# Patient Record
Sex: Female | Born: 1960 | Race: Black or African American | Hispanic: No | Smoking: Current every day smoker
Health system: Southern US, Community
[De-identification: ages and names within clinical notes are randomized; demographics above are authoritative.]

## PROBLEM LIST (undated history)

## (undated) DIAGNOSIS — I10 Essential (primary) hypertension: Secondary | ICD-10-CM

## (undated) HISTORY — PX: BREAST BIOPSY: SHX20

## (undated) HISTORY — PX: OTHER SURGICAL HISTORY: SHX169

## (undated) HISTORY — PX: REPLACEMENT TOTAL KNEE: SUR1224

---

## 2005-04-29 ENCOUNTER — Ambulatory Visit: Payer: Self-pay | Admitting: Internal Medicine

## 2006-05-05 ENCOUNTER — Ambulatory Visit: Payer: Self-pay | Admitting: Internal Medicine

## 2006-05-07 ENCOUNTER — Ambulatory Visit: Payer: Self-pay | Admitting: Internal Medicine

## 2008-07-21 ENCOUNTER — Ambulatory Visit: Payer: Self-pay | Admitting: Internal Medicine

## 2009-10-10 ENCOUNTER — Ambulatory Visit: Payer: Self-pay | Admitting: Internal Medicine

## 2010-08-11 ENCOUNTER — Emergency Department: Payer: Self-pay | Admitting: Internal Medicine

## 2010-08-21 ENCOUNTER — Ambulatory Visit: Payer: Self-pay | Admitting: Internal Medicine

## 2010-10-17 ENCOUNTER — Ambulatory Visit: Payer: Self-pay | Admitting: Internal Medicine

## 2011-11-21 ENCOUNTER — Ambulatory Visit: Payer: Self-pay | Admitting: Internal Medicine

## 2011-12-23 ENCOUNTER — Ambulatory Visit: Payer: Self-pay | Admitting: General Practice

## 2011-12-23 LAB — BASIC METABOLIC PANEL
Anion Gap: 6 — ABNORMAL LOW (ref 7–16)
BUN: 25 mg/dL — ABNORMAL HIGH (ref 7–18)
Calcium, Total: 9.5 mg/dL (ref 8.5–10.1)
Chloride: 108 mmol/L — ABNORMAL HIGH (ref 98–107)
EGFR (African American): >60
EGFR (Non-African Amer.): >60
Glucose: 89 mg/dL (ref 65–99)
Osmolality: 287 (ref 275–301)
Potassium: 3.1 mmol/L — ABNORMAL LOW (ref 3.5–5.1)

## 2011-12-23 LAB — CBC
MCH: 31.6 pg (ref 26.0–34.0)
MCHC: 32.5 g/dL (ref 32.0–36.0)
MCV: 97 fL (ref 80–100)
Platelet: 287 10*3/uL (ref 150–440)
RDW: 13 % (ref 11.5–14.5)
WBC: 4 10*3/uL (ref 3.6–11.0)

## 2011-12-23 LAB — URINALYSIS, COMPLETE
Blood: NEGATIVE
Ketone: NEGATIVE
Leukocyte Esterase: NEGATIVE
Ph: 6 (ref 4.5–8.0)
Protein: NEGATIVE
RBC,UR: <1 /HPF (ref 0–5)

## 2011-12-23 LAB — PROTIME-INR
INR: 0.9
Prothrombin Time: 12.5 secs (ref 11.5–14.7)

## 2011-12-23 LAB — MRSA PCR SCREENING

## 2011-12-23 LAB — APTT: Activated PTT: 29.3 secs (ref 23.6–35.9)

## 2012-01-06 ENCOUNTER — Inpatient Hospital Stay: Payer: Self-pay | Admitting: General Practice

## 2012-01-06 LAB — PREGNANCY, URINE: Pregnancy Test, Urine: NEGATIVE m[IU]/mL

## 2012-01-07 LAB — BASIC METABOLIC PANEL
Anion Gap: 12 (ref 7–16)
BUN: 13 mg/dL (ref 7–18)
Creatinine: 0.93 mg/dL (ref 0.60–1.30)
EGFR (African American): >60
EGFR (Non-African Amer.): >60

## 2012-01-08 LAB — BASIC METABOLIC PANEL
Anion Gap: 10 (ref 7–16)
BUN: 13 mg/dL (ref 7–18)
Calcium, Total: 8.5 mg/dL (ref 8.5–10.1)
Chloride: 108 mmol/L — ABNORMAL HIGH (ref 98–107)
EGFR (Non-African Amer.): >60
Osmolality: 283 (ref 275–301)
Potassium: 3.5 mmol/L (ref 3.5–5.1)
Sodium: 142 mmol/L (ref 136–145)

## 2012-01-08 LAB — HEMOGLOBIN: HGB: 10.6 g/dL — ABNORMAL LOW (ref 12.0–16.0)

## 2012-01-08 LAB — PLATELET COUNT: Platelet: 187 10*3/uL (ref 150–440)

## 2012-11-24 ENCOUNTER — Ambulatory Visit: Payer: Self-pay | Admitting: Internal Medicine

## 2014-01-20 ENCOUNTER — Ambulatory Visit: Payer: Self-pay | Admitting: Internal Medicine

## 2015-01-23 ENCOUNTER — Ambulatory Visit: Payer: Self-pay | Admitting: Internal Medicine

## 2015-03-05 NOTE — Op Note (Signed)
PATIENT NAME:  [name], [name] MR#:  [id number] DATE OF BIRTH:  [birth year]  DATE OF PROCEDURE:  01/06/2012  PREOPERATIVE DIAGNOSIS: Degenerative arthrosis of the right knee.   POSTOPERATIVE DIAGNOSIS: Degenerative arthrosis of the right knee.   PROCEDURE PERFORMED: Right total knee arthroplasty using computer-assisted navigation.   SURGEON: Illene LabradorJames P. Angie FavaHooten, Jr., MD   ASSISTANT: Van ClinesJon Wolfe, PA-C (required to maintain retraction throughout the procedure)   ANESTHESIA: Femoral nerve block and spinal.   ESTIMATED BLOOD LOSS: 50 mL.   FLUIDS REPLACED: 1600 mL of crystalloid.   TOURNIQUET TIME: 105 minutes.   DRAINS: Two medium drains to reinfusion system.   SOFT TISSUE RELEASES: Anterior cruciate ligament, posterior cruciate ligament, deep medial collateral ligament, patellofemoral ligament.   IMPLANTS UTILIZED DEPUY: DePuy PFC Sigma size 4 posterior stabilized femoral component (cemented), size 4 MBT component (cemented), 38 mm three-peg oval dome patella (cemented), and a 10 mm stabilized rotating platform polyethylene insert.   INDICATIONS FOR SURGERY: The patient is a 54 year old female who has been seen for complaints of progressive right knee pain. X-rays demonstrated severe degenerative changes in tricompartmental fashion with varus deformity. After discussion of the risks and benefits of surgical intervention, the patient expressed her understanding of the risks and benefits and agreed with plans for right total knee arthroplasty.   PROCEDURE IN DETAIL: The patient was brought into the Operating Room, and after adequate femoral nerve block and spinal anesthesia was achieved a tourniquet was placed on the patient's upper right thigh. The patient's right knee and leg were cleaned and prepped with alcohol and DuraPrep and draped in the usual sterile fashion. A "timeout" was performed as per usual protocol. The right lower extremity was exsanguinated using an Esmarch, and the tourniquet was  inflated to 300 mmHg. An anterior longitudinal incision was made, followed by a standard mid vastus approach. A large effusion was evacuated. Deep fibers of the medial collateral ligament were elevated in a subperiosteal fashion off the medial flare of the tibia so as to maintain a continuous soft tissue sleeve. The patella was subluxed laterally and the patellofemoral ligament was incised. Inspection of the knee demonstrated severe degenerative changes in tricompartmental fashion. Eburnated bone was noted to the medial compartment. Prominent osteophytes were debrided using a rongeur. Anterior and posterior cruciate ligaments were excised. Two 4.0 mm Schanz pins were inserted into the femur and into the tibia for attachment of the array of spheres used for computer-assisted navigation. Hip center was identified using a circumduction technique. Distal landmarks were mapped using the computer. The distal femur and proximal tibia were mapped using the computer. The distal femoral cutting guide was positioned using computer-assisted navigation so as to achieve a 5-degree distal valgus cut. Cut was performed and verified using the computer. The distal femur was sized, and it was felt that a size 4 femoral component was appropriate. A size 4 cutting guide was positioned using computer-assisted navigation, and the anterior cut was performed and verified using the computer. This was followed by completion of the posterior and chamfer cuts. The femoral cutting guide for a central box was then positioned, and the central box cut was performed.   Attention was then directed to the proximal tibia. Medial and lateral menisci were excised. The extramedullary tibial cutting guide was positioned using computer-assisted navigation so as to achieve 0 degrees varus-valgus alignment and 0 degrees posterior slope. Cut was performed and verified using the computer. The proximal tibia was then sized, and it was felt that a  size 4 tibial  tray was appropriate. Tibial and femoral trials were inserted, followed by insertion of a 10 mm polyethylene insert. Excellent medial and lateral soft tissue balancing was appreciated both in extension and in flexion. Finally, the patella was cut and prepared so as to accommodate a 38 mm three-peg oval dome patella. The patellar trial was placed and the knee was placed through a range of motion with excellent patellar tracking appreciated.   An osteotome was used to remove osteophytes from the posterior condyles. A large osteochondral loose body was removed from the medial popliteal space using curettes and rongeurs. The central post hole for the tibial tray was then reamed, followed by insertion of a keel punch. Tibial trials were then removed. The cut surfaces of bone were irrigated with copious amounts of normal saline with antibiotic solution using pulsatile lavage and then suctioned dry. Polymethylmethacrylate cement was prepared in the usual fashion using a vacuum mixer. Cement was applied to the cut surface of the proximal tibia as well as along the undersurface of a size 4 MBT tibial component. The tibial component was positioned and impacted in place. Excess cement was removed using freer elevators. Cement was then applied to the cut surface of the femur as well as along the posterior flanges of a size 4 posterior stabilized femoral component. The femoral component was positioned and impacted into place. Excess cement was removed using freer elevators. A 10 mm polyethylene trial was inserted, and the knee was brought in full extension with steady axial compression applied. Finally, cement was applied to the backside of a 38 mm three-peg oval dome patella, and the patellar component was positioned and patellar clamp applied. Excess cement was removed using freer elevators.   After adequate curing of cement, the tourniquet was deflated after a total tourniquet time of 105 minutes. Hemostasis was achieved  using electrocautery. The knee was irrigated with copious amounts of normal saline with antibiotic solution using pulsatile lavage and then suctioned dry. The knee was inspected for any residual cement debris, and 30 mL of 0.25% Marcaine with epinephrine was injected along the posterior capsule. A 10 mm stabilized rotating platform polyethylene insert was placed, and the knee was placed through a range of motion. Excellent medial and lateral soft tissue balancing was appreciated both in full extension and in 90 degrees of flexion. Excellent patellar tracking was appreciated.   Two medium drains were placed in the wound bed and brought out through a separate stab incision to be attached to a reinfusion system. The medial parapatellar portion of the incision was reapproximated interrupted sutures of #1 Vicryl. The subcutaneous tissue was approximated in layers using first #0 Vicryl followed, by 2-0 Vicryl. The skin was closed with skin staples. A sterile dressing was applied.   The patient tolerated the procedure well. She was transported to the recovery room in stable condition.    ____________________________ Illene Labrador. Angie Fava., MD jph:cbb D: 01/06/2012 17:17:23 ET T: 01/07/2012 11:34:08 ET JOB#: [id number]  cc: Fayrene Fearing P. Angie Fava., MD, <Dictator> Illene Labrador Angie Fava MD ELECTRONICALLY SIGNED 01/08/2012 17:54

## 2015-03-05 NOTE — Discharge Summary (Signed)
PATIENT NAME:  [name], [name] MR#:  [id number] DATE OF BIRTH:  1960-12-01  DATE OF ADMISSION:  01/06/2012 DATE OF DISCHARGE:  01/08/2012  ADMITTING DIAGNOSIS: Degenerative arthrosis of the right knee.   DISCHARGE DIAGNOSIS: Degenerative arthrosis of the right knee.   HISTORY: The patient is a 54 year old who has been followed at [facility] for progression of right knee pain. The patient has had greater than a 10 year history of right knee pain. She had previously undergone a right knee arthroscopy in 2000 performed by Dr. Eulah Pont in [place]. She reports progressive worsening of the right knee pain over the last three years. She had localized most of her pain along the medial aspect of the knee. She had not seen any significant improvement in her condition despite the use of anti-inflammatory medications, Synvisc injections as well as cortisone injections and activity modification. Her pain was noted to be aggravated with weight-bearing activities such as stair ambulation. At the time of surgery, she was not using any type of ambulatory aid. She had denied any significant swelling, locking or giving way of the knee. Her pain had progressed to the point that it was significantly interfering with her activities of daily living. X-rays taken in the clinic showed narrowing of the medial cartilage space with associated varus alignment. She was noted to have osteophyte as well as subchondral sclerosis. After a discussion of the risks and benefits of surgical intervention, the patient expressed her understanding of the risks and benefits and agreed for plans for surgical intervention.   PROCEDURE: Right total knee arthroplasty using computer-assisted navigation.   ANESTHESIA: Femoral nerve block and spinal.   SOFT TISSUE RELEASE: Anterior cruciate ligament, posterior cruciate ligament, deep medial collateral ligaments, as well as the patellofemoral ligament.   IMPLANTS UTILIZED: DePuy PFC Sigma size  four posterior stabilized femoral component (cemented), size four MBT tibial component (cemented), 38 mm three-pegged oval dome patella (cemented), and a 10 mm stabilized rotating platform polyethylene insert.   HOSPITAL COURSE: The patient tolerated the procedure very well. She had no complications. She was then taken to the PAC-U where she was stabilized and then transferred to the orthopedic floor. The patient began receiving anticoagulation therapy, Lovenox 30 mg subcutaneous every 12 hours per anesthesia and pharmacy protocol. She was fitted with TED stockings bilaterally. These are allowed to be removed one hour per eight hour shift. The right leg was applied on day two following removal of the Hemovac and dressing change. She was also fitted with the AV-I compression foot pumps set at 80 mmHg bilaterally. She has had no evidence of any deep venous thromboses of the lower extremities. Calves have been nontender. Negative Homans sign. Heels were elevated off the bed using rolled towels. There was no tissue breakdown noted to the heels or any tenderness to palpation.   The patient has denied any chest pain or shortness of breath. Vital signs have been stable. Hemodynamically, she was stable. No transfusions were given other than the Autovac transfusion the first six hours postoperatively.   Physical therapy was initiated on day one for activities of daily living and assistive devices. She has progressed very nicely. Upon being discharged, was ambulating greater than 200 feet. She was able go up and down four sets of steps. She was independent with bed to chair transfers. Occupational therapy was also initiated on day one for activities of daily living and assistive devices.   The patient's IV, catheter, and Hemovac were all discontinued on day two  along with the dressing change. The wound was free of any drainage or signs of infection.   DISPOSITION: The patient is being discharged to home in improved  stable condition.   DISCHARGE INSTRUCTIONS:  1. She may weight bear as tolerated.  2. Continue using a walker until cleared by physical therapy to go to a quad cane.  3. She will continue with TED stockings. These are to be worn during the day, but may remove at night.  4. Continue Polar Care maintaining a temperature of 40 to 50 degrees Fahrenheit.  5. She was instructed in wound care. She is not to get the wound wet before the staples are removed.  6. She will follow-up in clinic in two weeks postoperative for dressing change and removal of staples, or sooner if any temperatures of 101.5 or greater or excessive bleeding.  7. She will receive home health physical therapy.  8. She is placed on a regular diet.  9. She is to resume her regular medication that she was on prior to admission.  10. She was given a prescription for Lovenox 40 mg subcutaneous daily for 14 days, then discontinue and begin taking one 81 mg enteric-coated aspirin.  11. She was given a prescription for oxycodone 1 to 2 tablets every 4 to 6 hours p.r.n. for pain as well as Ultram 1 to 2 tablets every 4 to 6 hours p.r.n. for pain.   PAST MEDICAL HISTORY:  1. Hypertension.  2. Arthritis.  3. Kidney stones.    ____________________________ Van ClinesJon Marshall Roehrich, PA jrw:ap D: 01/20/2012 14:01:40 ET T: 01/20/2012 14:12:57 ET JOB#: [id number]  cc: Van ClinesJon Lashawne Dura, PA, <Dictator> Leondre Taul PA ELECTRONICALLY SIGNED 01/21/2012 7:46

## 2015-12-18 DIAGNOSIS — I878 Other specified disorders of veins: Secondary | ICD-10-CM | POA: Insufficient documentation

## 2015-12-18 DIAGNOSIS — I10 Essential (primary) hypertension: Secondary | ICD-10-CM | POA: Insufficient documentation

## 2015-12-28 ENCOUNTER — Other Ambulatory Visit: Payer: Self-pay | Admitting: Internal Medicine

## 2015-12-28 DIAGNOSIS — Z1231 Encounter for screening mammogram for malignant neoplasm of breast: Secondary | ICD-10-CM

## 2016-01-24 ENCOUNTER — Ambulatory Visit
Admission: RE | Admit: 2016-01-24 | Discharge: 2016-01-24 | Disposition: A | Discharge status: Home / Self Care | Payer: 59 | Source: Ambulatory Visit | Attending: Internal Medicine | Admitting: Internal Medicine

## 2016-01-24 DIAGNOSIS — Z1231 Encounter for screening mammogram for malignant neoplasm of breast: Secondary | ICD-10-CM | POA: Insufficient documentation

## 2016-05-23 ENCOUNTER — Emergency Department
Admission: EM | Admit: 2016-05-23 | Discharge: 2016-05-23 | Disposition: A | Discharge status: Home / Self Care | Payer: 59 | Attending: Emergency Medicine | Admitting: Emergency Medicine

## 2016-05-23 ENCOUNTER — Emergency Department: Payer: 59

## 2016-05-23 ENCOUNTER — Encounter: Payer: Self-pay | Admitting: Emergency Medicine

## 2016-05-23 DIAGNOSIS — S8001XA Contusion of right knee, initial encounter: Secondary | ICD-10-CM

## 2016-05-23 DIAGNOSIS — Y92410 Unspecified street and highway as the place of occurrence of the external cause: Secondary | ICD-10-CM | POA: Diagnosis not present

## 2016-05-23 DIAGNOSIS — Y9389 Activity, other specified: Secondary | ICD-10-CM | POA: Diagnosis not present

## 2016-05-23 DIAGNOSIS — F17200 Nicotine dependence, unspecified, uncomplicated: Secondary | ICD-10-CM | POA: Insufficient documentation

## 2016-05-23 DIAGNOSIS — M25561 Pain in right knee: Secondary | ICD-10-CM | POA: Diagnosis present

## 2016-05-23 DIAGNOSIS — I10 Essential (primary) hypertension: Secondary | ICD-10-CM | POA: Insufficient documentation

## 2016-05-23 DIAGNOSIS — M1712 Unilateral primary osteoarthritis, left knee: Secondary | ICD-10-CM

## 2016-05-23 DIAGNOSIS — M179 Osteoarthritis of knee, unspecified: Secondary | ICD-10-CM | POA: Diagnosis not present

## 2016-05-23 DIAGNOSIS — Y999 Unspecified external cause status: Secondary | ICD-10-CM | POA: Diagnosis not present

## 2016-05-23 DIAGNOSIS — S8002XA Contusion of left knee, initial encounter: Secondary | ICD-10-CM | POA: Diagnosis not present

## 2016-05-23 HISTORY — DX: Essential (primary) hypertension: I10

## 2016-05-23 MED ORDER — CYCLOBENZAPRINE HCL 10 MG PO TABS: 10.0000 mg | ORAL_TABLET | Freq: Three times a day (TID) | ORAL | Status: DC | PRN

## 2016-05-23 MED ORDER — NAPROXEN 500 MG PO TABS: 500.0000 mg | ORAL_TABLET | Freq: Two times a day (BID) | ORAL | Status: DC
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
## 2016-11-11 DIAGNOSIS — M1712 Unilateral primary osteoarthritis, left knee: Secondary | ICD-10-CM | POA: Insufficient documentation

## 2016-11-11 DIAGNOSIS — Z96651 Presence of right artificial knee joint: Secondary | ICD-10-CM | POA: Insufficient documentation

## 2016-12-10 ENCOUNTER — Encounter
Admission: RE | Admit: 2016-12-10 | Discharge: 2016-12-10 | Disposition: A | Discharge status: Home / Self Care | Payer: 59 | Source: Ambulatory Visit | Attending: Orthopedic Surgery | Admitting: Orthopedic Surgery

## 2016-12-10 DIAGNOSIS — I10 Essential (primary) hypertension: Secondary | ICD-10-CM | POA: Insufficient documentation

## 2016-12-10 DIAGNOSIS — Z01812 Encounter for preprocedural laboratory examination: Secondary | ICD-10-CM | POA: Diagnosis present

## 2016-12-10 DIAGNOSIS — Z01810 Encounter for preprocedural cardiovascular examination: Secondary | ICD-10-CM | POA: Insufficient documentation

## 2016-12-10 LAB — COMPREHENSIVE METABOLIC PANEL
ALT: 16 U/L (ref 14–54)
ANION GAP: 8 (ref 5–15)
AST: 18 U/L (ref 15–41)
Albumin: 4.2 g/dL (ref 3.5–5.0)
Alkaline Phosphatase: 91 U/L (ref 38–126)
BUN: 17 mg/dL (ref 6–20)
CHLORIDE: 101 mmol/L (ref 101–111)
CO2: 28 mmol/L (ref 22–32)
CREATININE: 1.04 mg/dL — AB (ref 0.44–1.00)
Calcium: 9.6 mg/dL (ref 8.9–10.3)
GFR, EST NON AFRICAN AMERICAN: 59 mL/min — AB (ref >60)
Glucose, Bld: 88 mg/dL (ref 65–99)
Potassium: 3.2 mmol/L — ABNORMAL LOW (ref 3.5–5.1)
SODIUM: 137 mmol/L (ref 135–145)
Total Bilirubin: 0.6 mg/dL (ref 0.3–1.2)
Total Protein: 7.8 g/dL (ref 6.5–8.1)

## 2016-12-10 LAB — TYPE AND SCREEN
ABO/RH(D): B POS
Antibody Screen: NEGATIVE

## 2016-12-10 LAB — URINALYSIS, ROUTINE W REFLEX MICROSCOPIC
Bilirubin Urine: NEGATIVE
GLUCOSE, UA: NEGATIVE mg/dL
Hgb urine dipstick: NEGATIVE
Ketones, ur: NEGATIVE mg/dL
LEUKOCYTES UA: NEGATIVE
Nitrite: NEGATIVE
Protein, ur: NEGATIVE mg/dL
Specific Gravity, Urine: 1.012 (ref 1.005–1.030)
pH: 6 (ref 5.0–8.0)

## 2016-12-10 LAB — PROTIME-INR
INR: 0.95
PROTHROMBIN TIME: 12.7 s (ref 11.4–15.2)

## 2016-12-10 LAB — CBC
HCT: 38.9 % (ref 35.0–47.0)
Hemoglobin: 13.7 g/dL (ref 12.0–16.0)
MCH: 32.9 pg (ref 26.0–34.0)
MCHC: 35.3 g/dL (ref 32.0–36.0)
MCV: 93.2 fL (ref 80.0–100.0)
PLATELETS: 302 10*3/uL (ref 150–440)
RBC: 4.18 MIL/uL (ref 3.80–5.20)
RDW: 13.3 % (ref 11.5–14.5)
WBC: 4.1 10*3/uL (ref 3.6–11.0)

## 2016-12-10 LAB — C-REACTIVE PROTEIN: CRP: <0.8 mg/dL (ref <1.0)

## 2016-12-10 LAB — SEDIMENTATION RATE: Sed Rate: 16 mm/hr (ref 0–30)

## 2016-12-10 LAB — SURGICAL PCR SCREEN
MRSA, PCR: NEGATIVE
Staphylococcus aureus: NEGATIVE

## 2016-12-10 LAB — APTT: aPTT: 32 seconds (ref 24–36)

## 2016-12-10 NOTE — Pre-Procedure Instructions (Signed)
Potassium result and request for increase in potassium supplement faxed to Dr Elenor LegatoHooten's office.

## 2016-12-10 NOTE — Patient Instructions (Signed)
  Your procedure is scheduled on: 12/23/16 Mon Report to Same Day Surgery 2nd floor medical mall [facility](Medical Mall Entrance-take elevator on left to 2nd floor.  Check in with surgery information desk.) To find out your arrival time please call [phone] between 1PM - 3PM on 12/20/16 Fri Remember: Instructions that are not followed completely may result in serious medical risk, up to and including death, or upon the discretion of your surgeon and anesthesiologist your surgery may need to be rescheduled.    _x___ 1. Do not eat food or drink liquids after midnight. No gum chewing or hard candies.     __x__ 2. No Alcohol for 24 hours before or after surgery.   __x__3. No Smoking for 24 prior to surgery.   ____  4. Bring all medications with you on the day of surgery if instructed.    __x__ 5. Notify your doctor if there is any change in your medical condition     (cold, fever, infections).     Do not wear jewelry, make-up, hairpins, clips or nail polish.  Do not wear lotions, powders, or perfumes. You may wear deodorant.  Do not shave 48 hours prior to surgery. Men may shave face and neck.  Do not bring valuables to the hospital.    [facility] is not [facility] for any belongings or valuables.               Contacts, dentures or bridgework may not be worn into surgery.  Leave your suitcase in the car. After surgery it may be brought to your room.  For patients admitted to the hospital, discharge time is determined by your treatment team.   Patients discharged the day of surgery will not be allowed to drive home.  You will need someone to drive you home and stay with you the night of your procedure.    Please read over the following fact sheets that you were given:   [facility] Pre[facility] and or MRSA Information   _x___ Take these medicines the morning of surgery with A SIP OF WATER:    1. None  2.  3.  4.  5.  6.  ____Fleets enema or Magnesium Citrate as  directed.   _x___ Use CHG Soap or sage wipes as directed on instruction sheet   ____ Use inhalers on the day of surgery and bring to hospital day of surgery  ____ Stop metformin 2 days prior to surgery    ____ Take 1/2 of usual insulin dose the night before surgery and none on the morning of           surgery.   ____ Stop Aspirin, Coumadin, Pllavix ,Eliquis, Effient, or Pradaxa  x__ Stop Anti-inflammatories such as Advil, Aleve, Ibuprofen, Motrin, Naproxen,          Naprosyn, Goodies powders or aspirin products. Ok to take Tylenol.   ____ Stop supplements until after surgery.    ____ Bring C-Pap to the hospital.

## 2016-12-11 LAB — URINE CULTURE
CULTURE: NO GROWTH
SPECIAL REQUESTS: NORMAL

## 2016-12-22 MED ORDER — CLINDAMYCIN PHOSPHATE 900 MG/50ML IV SOLN
900.0000 mg | INTRAVENOUS | Status: AC
  Administered 2016-12-23: 900 mg via INTRAVENOUS

## 2016-12-22 MED ORDER — TRANEXAMIC ACID 1000 MG/10ML IV SOLN
1000.0000 mg | INTRAVENOUS | Status: AC
  Administered 2016-12-23: 1000 mg via INTRAVENOUS
  Filled 2016-12-22: qty 10

## 2016-12-23 ENCOUNTER — Inpatient Hospital Stay: Payer: 59

## 2016-12-23 ENCOUNTER — Inpatient Hospital Stay: Payer: 59 | Admitting: Anesthesiology

## 2016-12-23 ENCOUNTER — Encounter
Admission: RE | Disposition: A | Discharge status: Home / Self Care | Payer: Self-pay | Source: Ambulatory Visit | Attending: Orthopedic Surgery

## 2016-12-23 ENCOUNTER — Encounter: Payer: Self-pay | Admitting: *Deleted

## 2016-12-23 ENCOUNTER — Inpatient Hospital Stay
Admission: RE | Admit: 2016-12-23 | Discharge: 2016-12-25 | DRG: 470 | Disposition: A | Discharge status: Home / Self Care | Payer: 59 | Source: Ambulatory Visit | Attending: Orthopedic Surgery | Admitting: Orthopedic Surgery

## 2016-12-23 DIAGNOSIS — I10 Essential (primary) hypertension: Secondary | ICD-10-CM | POA: Diagnosis present

## 2016-12-23 DIAGNOSIS — M6281 Muscle weakness (generalized): Secondary | ICD-10-CM

## 2016-12-23 DIAGNOSIS — M1712 Unilateral primary osteoarthritis, left knee: Principal | ICD-10-CM | POA: Diagnosis present

## 2016-12-23 DIAGNOSIS — Z96659 Presence of unspecified artificial knee joint: Secondary | ICD-10-CM

## 2016-12-23 HISTORY — PX: KNEE ARTHROPLASTY: SHX992

## 2016-12-23 LAB — POCT I-STAT 4, (NA,K, GLUC, HGB,HCT)
GLUCOSE: 95 mg/dL (ref 65–99)
HCT: 37 % (ref 36.0–46.0)
Hemoglobin: 12.6 g/dL (ref 12.0–15.0)
POTASSIUM: 3.8 mmol/L (ref 3.5–5.1)
SODIUM: 140 mmol/L (ref 135–145)

## 2016-12-23 LAB — ABO/RH: ABO/RH(D): B POS

## 2016-12-23 SURGERY — ARTHROPLASTY, KNEE, TOTAL, USING IMAGELESS COMPUTER-ASSISTED NAVIGATION
Anesthesia: Spinal | Laterality: Left | Wound class: Clean

## 2016-12-23 MED ORDER — FAMOTIDINE 20 MG PO TABS
20.0000 mg | ORAL_TABLET | Freq: Once | ORAL | Status: AC
  Administered 2016-12-23: 20 mg via ORAL

## 2016-12-23 MED ORDER — KETAMINE HCL 100 MG/ML IJ SOLN
INTRAMUSCULAR | Status: DC | PRN
  Administered 2016-12-23: 7 ug/kg/min via INTRAVENOUS

## 2016-12-23 MED ORDER — PROPOFOL 10 MG/ML IV BOLUS
INTRAVENOUS | Status: AC
  Filled 2016-12-23: qty 20

## 2016-12-23 MED ORDER — ACETAMINOPHEN 325 MG PO TABS: 650.0000 mg | ORAL_TABLET | Freq: Four times a day (QID) | ORAL | Status: DC | PRN

## 2016-12-23 MED ORDER — LACTATED RINGERS IV SOLN
INTRAVENOUS | Status: DC
  Administered 2016-12-23 (×2): via INTRAVENOUS

## 2016-12-23 MED ORDER — ACETAMINOPHEN 650 MG RE SUPP: 650.0000 mg | Freq: Four times a day (QID) | RECTAL | Status: DC | PRN

## 2016-12-23 MED ORDER — SODIUM CHLORIDE 0.9 % IJ SOLN
INTRAMUSCULAR | Status: AC
  Filled 2016-12-23: qty 50

## 2016-12-23 MED ORDER — ONDANSETRON HCL 4 MG/2ML IJ SOLN
4.0000 mg | Freq: Four times a day (QID) | INTRAMUSCULAR | Status: DC | PRN
  Administered 2016-12-23: 4 mg via INTRAVENOUS
  Filled 2016-12-23: qty 2

## 2016-12-23 MED ORDER — BUPIVACAINE HCL (PF) 0.25 % IJ SOLN
INTRAMUSCULAR | Status: AC
  Filled 2016-12-23: qty 60

## 2016-12-23 MED ORDER — MIDAZOLAM HCL 2 MG/2ML IJ SOLN
INTRAMUSCULAR | Status: AC
  Filled 2016-12-23: qty 2

## 2016-12-23 MED ORDER — ALUM & MAG HYDROXIDE-SIMETH 200-200-20 MG/5ML PO SUSP: 30.0000 mL | ORAL | Status: DC | PRN

## 2016-12-23 MED ORDER — METOCLOPRAMIDE HCL 10 MG PO TABS
10.0000 mg | ORAL_TABLET | Freq: Three times a day (TID) | ORAL | Status: AC
  Administered 2016-12-23 – 2016-12-25 (×8): 10 mg via ORAL
  Filled 2016-12-23 (×8): qty 1

## 2016-12-23 MED ORDER — FERROUS SULFATE 325 (65 FE) MG PO TABS
325.0000 mg | ORAL_TABLET | Freq: Two times a day (BID) | ORAL | Status: DC
  Administered 2016-12-23 – 2016-12-25 (×4): 325 mg via ORAL
  Filled 2016-12-23 (×4): qty 1

## 2016-12-23 MED ORDER — FENTANYL CITRATE (PF) 100 MCG/2ML IJ SOLN
25.0000 ug | INTRAMUSCULAR | Status: DC | PRN
  Administered 2016-12-23 (×4): 25 ug via INTRAVENOUS

## 2016-12-23 MED ORDER — FAMOTIDINE 20 MG PO TABS
ORAL_TABLET | ORAL | Status: AC
  Administered 2016-12-23: 20 mg via ORAL
  Filled 2016-12-23: qty 1

## 2016-12-23 MED ORDER — SODIUM CHLORIDE 0.9 % IV SOLN
INTRAVENOUS | Status: DC
  Administered 2016-12-23 – 2016-12-24 (×2): via INTRAVENOUS

## 2016-12-23 MED ORDER — HYDROCHLOROTHIAZIDE 25 MG PO TABS
25.0000 mg | ORAL_TABLET | Freq: Every day | ORAL | Status: DC
  Administered 2016-12-23 – 2016-12-24 (×2): 25 mg via ORAL
  Filled 2016-12-23 (×2): qty 1

## 2016-12-23 MED ORDER — CLINDAMYCIN PHOSPHATE 900 MG/50ML IV SOLN
INTRAVENOUS | Status: AC
  Filled 2016-12-23: qty 50

## 2016-12-23 MED ORDER — SODIUM CHLORIDE 0.9 % IV SOLN
INTRAVENOUS | Status: DC | PRN
  Administered 2016-12-23: 60 mL

## 2016-12-23 MED ORDER — LOSARTAN POTASSIUM-HCTZ 100-25 MG PO TABS: 1.0000 | ORAL_TABLET | Freq: Every day | ORAL | Status: DC

## 2016-12-23 MED ORDER — PHENOL 1.4 % MT LIQD
1.0000 | OROMUCOSAL | Status: DC | PRN
  Filled 2016-12-23: qty 177

## 2016-12-23 MED ORDER — ACETAMINOPHEN 10 MG/ML IV SOLN
1000.0000 mg | Freq: Four times a day (QID) | INTRAVENOUS | Status: AC
  Administered 2016-12-23 – 2016-12-24 (×4): 1000 mg via INTRAVENOUS
  Filled 2016-12-23 (×4): qty 100

## 2016-12-23 MED ORDER — CLINDAMYCIN PHOSPHATE 600 MG/50ML IV SOLN
600.0000 mg | Freq: Four times a day (QID) | INTRAVENOUS | Status: AC
  Administered 2016-12-23 – 2016-12-24 (×4): 600 mg via INTRAVENOUS
  Filled 2016-12-23 (×5): qty 50

## 2016-12-23 MED ORDER — KETAMINE HCL 50 MG/ML IJ SOLN
INTRAMUSCULAR | Status: DC | PRN
  Administered 2016-12-23: 2 mg via INTRAMUSCULAR

## 2016-12-23 MED ORDER — ACETAMINOPHEN 10 MG/ML IV SOLN
INTRAVENOUS | Status: AC
  Filled 2016-12-23: qty 100

## 2016-12-23 MED ORDER — AMLODIPINE BESYLATE 5 MG PO TABS
5.0000 mg | ORAL_TABLET | Freq: Every day | ORAL | Status: DC
  Administered 2016-12-23 – 2016-12-24 (×2): 5 mg via ORAL
  Filled 2016-12-23 (×2): qty 1

## 2016-12-23 MED ORDER — PROPOFOL 500 MG/50ML IV EMUL
INTRAVENOUS | Status: AC
  Filled 2016-12-23: qty 50

## 2016-12-23 MED ORDER — DIPHENHYDRAMINE HCL 12.5 MG/5ML PO ELIX: 12.5000 mg | ORAL_SOLUTION | ORAL | Status: DC | PRN

## 2016-12-23 MED ORDER — ONDANSETRON HCL 4 MG PO TABS: 4.0000 mg | ORAL_TABLET | Freq: Four times a day (QID) | ORAL | Status: DC | PRN

## 2016-12-23 MED ORDER — FENTANYL CITRATE (PF) 100 MCG/2ML IJ SOLN
INTRAMUSCULAR | Status: AC
  Administered 2016-12-23: 25 ug via INTRAVENOUS
  Filled 2016-12-23: qty 2

## 2016-12-23 MED ORDER — ONDANSETRON HCL 4 MG/2ML IJ SOLN: 4.0000 mg | Freq: Once | INTRAMUSCULAR | Status: DC | PRN

## 2016-12-23 MED ORDER — FENTANYL CITRATE (PF) 100 MCG/2ML IJ SOLN
INTRAMUSCULAR | Status: DC | PRN
Start: 2016-12-23 — End: 2016-12-23
  Administered 2016-12-23: 100 ug via INTRAVENOUS

## 2016-12-23 MED ORDER — SODIUM CHLORIDE 0.9 % IV SOLN
INTRAVENOUS | Status: DC | PRN
  Administered 2016-12-23: 30 ug/min via INTRAVENOUS

## 2016-12-23 MED ORDER — BUPIVACAINE LIPOSOME 1.3 % IJ SUSP
INTRAMUSCULAR | Status: AC
  Filled 2016-12-23: qty 20

## 2016-12-23 MED ORDER — CELECOXIB 200 MG PO CAPS
200.0000 mg | ORAL_CAPSULE | Freq: Two times a day (BID) | ORAL | Status: DC
  Administered 2016-12-23 – 2016-12-25 (×5): 200 mg via ORAL
  Filled 2016-12-23 (×5): qty 1

## 2016-12-23 MED ORDER — BUPIVACAINE HCL (PF) 0.25 % IJ SOLN
INTRAMUSCULAR | Status: DC | PRN
  Administered 2016-12-23: 60 mL

## 2016-12-23 MED ORDER — LOSARTAN POTASSIUM 50 MG PO TABS
100.0000 mg | ORAL_TABLET | Freq: Every day | ORAL | Status: DC
  Administered 2016-12-23 – 2016-12-24 (×2): 100 mg via ORAL
  Filled 2016-12-23 (×2): qty 2

## 2016-12-23 MED ORDER — BISACODYL 10 MG RE SUPP
10.0000 mg | Freq: Every day | RECTAL | Status: DC | PRN
  Administered 2016-12-25: 10 mg via RECTAL
  Filled 2016-12-23: qty 1

## 2016-12-23 MED ORDER — FENTANYL CITRATE (PF) 100 MCG/2ML IJ SOLN
INTRAMUSCULAR | Status: AC
  Filled 2016-12-23: qty 2

## 2016-12-23 MED ORDER — OXYCODONE HCL 5 MG PO TABS
5.0000 mg | ORAL_TABLET | ORAL | Status: DC | PRN
  Administered 2016-12-23 (×2): 5 mg via ORAL
  Administered 2016-12-23 – 2016-12-24 (×3): 10 mg via ORAL
  Administered 2016-12-24: 5 mg via ORAL
  Administered 2016-12-24 – 2016-12-25 (×4): 10 mg via ORAL
  Filled 2016-12-23 (×3): qty 2
  Filled 2016-12-23: qty 1
  Filled 2016-12-23 (×2): qty 2
  Filled 2016-12-23: qty 1
  Filled 2016-12-23 (×3): qty 2

## 2016-12-23 MED ORDER — NEOMYCIN-POLYMYXIN B GU 40-200000 IR SOLN
Status: DC | PRN
  Administered 2016-12-23: 2 mL

## 2016-12-23 MED ORDER — NEOMYCIN-POLYMYXIN B GU 40-200000 IR SOLN
Status: AC
  Filled 2016-12-23: qty 20

## 2016-12-23 MED ORDER — DEXAMETHASONE SODIUM PHOSPHATE 10 MG/ML IJ SOLN
INTRAMUSCULAR | Status: AC
  Filled 2016-12-23: qty 1

## 2016-12-23 MED ORDER — SODIUM CHLORIDE 0.9 % IV SOLN
1000.0000 mg | Freq: Once | INTRAVENOUS | Status: AC
  Administered 2016-12-23: 1000 mg via INTRAVENOUS
  Filled 2016-12-23: qty 10

## 2016-12-23 MED ORDER — ENOXAPARIN SODIUM 30 MG/0.3ML ~~LOC~~ SOLN
30.0000 mg | Freq: Two times a day (BID) | SUBCUTANEOUS | Status: DC
  Administered 2016-12-24 – 2016-12-25 (×3): 30 mg via SUBCUTANEOUS
  Filled 2016-12-23 (×3): qty 0.3

## 2016-12-23 MED ORDER — GLYCOPYRROLATE 0.2 MG/ML IJ SOLN
INTRAMUSCULAR | Status: DC | PRN
  Administered 2016-12-23: 0.2 mg via INTRAVENOUS

## 2016-12-23 MED ORDER — MORPHINE SULFATE (PF) 2 MG/ML IV SOLN: 2.0000 mg | INTRAVENOUS | Status: DC | PRN

## 2016-12-23 MED ORDER — LORATADINE 10 MG PO TABS
10.0000 mg | ORAL_TABLET | Freq: Every day | ORAL | Status: DC
Start: 2016-12-24 — End: 2016-12-25
  Administered 2016-12-24 – 2016-12-25 (×2): 10 mg via ORAL
  Filled 2016-12-23 (×2): qty 1

## 2016-12-23 MED ORDER — CHLORHEXIDINE GLUCONATE 4 % EX LIQD: 60.0000 mL | Freq: Once | CUTANEOUS | Status: DC

## 2016-12-23 MED ORDER — POTASSIUM CHLORIDE CRYS ER 20 MEQ PO TBCR
20.0000 meq | EXTENDED_RELEASE_TABLET | Freq: Every day | ORAL | Status: DC
  Administered 2016-12-23 – 2016-12-24 (×2): 20 meq via ORAL
  Filled 2016-12-23 (×2): qty 1

## 2016-12-23 MED ORDER — PANTOPRAZOLE SODIUM 40 MG PO TBEC
40.0000 mg | DELAYED_RELEASE_TABLET | Freq: Two times a day (BID) | ORAL | Status: DC
  Administered 2016-12-23 – 2016-12-25 (×4): 40 mg via ORAL
  Filled 2016-12-23 (×4): qty 1

## 2016-12-23 MED ORDER — PROPOFOL 10 MG/ML IV BOLUS
INTRAVENOUS | Status: DC | PRN
  Administered 2016-12-23: 20 mg via INTRAVENOUS

## 2016-12-23 MED ORDER — MENTHOL 3 MG MT LOZG
1.0000 | LOZENGE | OROMUCOSAL | Status: DC | PRN
  Filled 2016-12-23: qty 9

## 2016-12-23 MED ORDER — SENNOSIDES-DOCUSATE SODIUM 8.6-50 MG PO TABS
1.0000 | ORAL_TABLET | Freq: Two times a day (BID) | ORAL | Status: DC
  Administered 2016-12-23 – 2016-12-25 (×4): 1 via ORAL
  Filled 2016-12-23 (×4): qty 1

## 2016-12-23 MED ORDER — TRAMADOL HCL 50 MG PO TABS
50.0000 mg | ORAL_TABLET | ORAL | Status: DC | PRN
  Administered 2016-12-24: 50 mg via ORAL
  Administered 2016-12-25: 100 mg via ORAL
  Filled 2016-12-23: qty 1
  Filled 2016-12-23: qty 2

## 2016-12-23 MED ORDER — FLEET ENEMA 7-19 GM/118ML RE ENEM: 1.0000 | ENEMA | Freq: Once | RECTAL | Status: DC | PRN

## 2016-12-23 MED ORDER — GLYCOPYRROLATE 0.2 MG/ML IJ SOLN
INTRAMUSCULAR | Status: AC
  Filled 2016-12-23: qty 1

## 2016-12-23 MED ORDER — DEXAMETHASONE SODIUM PHOSPHATE 4 MG/ML IJ SOLN
INTRAMUSCULAR | Status: DC | PRN
  Administered 2016-12-23: 10 mg via INTRAVENOUS

## 2016-12-23 MED ORDER — PROPOFOL 500 MG/50ML IV EMUL
INTRAVENOUS | Status: DC | PRN
  Administered 2016-12-23: 70 ug/kg/min via INTRAVENOUS

## 2016-12-23 MED ORDER — MAGNESIUM HYDROXIDE 400 MG/5ML PO SUSP
30.0000 mL | Freq: Every day | ORAL | Status: DC | PRN
  Administered 2016-12-25: 30 mL via ORAL
  Filled 2016-12-23: qty 30

## 2016-12-23 MED ORDER — KETAMINE HCL 10 MG/ML IJ SOLN
INTRAMUSCULAR | Status: AC
  Filled 2016-12-23: qty 1

## 2016-12-23 MED ORDER — MIDAZOLAM HCL 5 MG/5ML IJ SOLN
INTRAMUSCULAR | Status: DC | PRN
  Administered 2016-12-23: 2 mg via INTRAVENOUS

## 2016-12-23 MED ORDER — ACETAMINOPHEN 10 MG/ML IV SOLN
INTRAVENOUS | Status: DC | PRN
  Administered 2016-12-23: 1000 mg via INTRAVENOUS

## 2016-12-23 SURGICAL SUPPLY — 61 items
AUTOTRANSFUS HAS 1/8 (MISCELLANEOUS) ×2
BATTERY INSTRU NAVIGATION (MISCELLANEOUS) ×8 IMPLANT
BLADE SAW 1 (BLADE) ×2 IMPLANT
BLADE SAW 1/2 (BLADE) ×2 IMPLANT
BLADE SAW 70X12.5 (BLADE) IMPLANT
BONE CEMENT GENTAMICIN (Cement) ×4 IMPLANT
CANISTER SUCT 1200ML W/VALVE (MISCELLANEOUS) ×2 IMPLANT
CANISTER SUCT 3000ML (MISCELLANEOUS) ×4 IMPLANT
CAP KNEE TOTAL 3 SIGMA ×2 IMPLANT
CATH TRAY METER 16FR LF (MISCELLANEOUS) ×2 IMPLANT
CEMENT BONE GENTAMICIN 40 (Cement) ×2 IMPLANT
COOLER POLAR GLACIER W/PUMP (MISCELLANEOUS) ×2 IMPLANT
CUFF TOURN 30 STER DUAL PORT (MISCELLANEOUS) ×2 IMPLANT
DRAPE SHEET LG 3/4 BI-LAMINATE (DRAPES) ×2 IMPLANT
DRSG DERMACEA 8X12 NADH (GAUZE/BANDAGES/DRESSINGS) ×2 IMPLANT
DRSG OPSITE POSTOP 4X14 (GAUZE/BANDAGES/DRESSINGS) ×2 IMPLANT
DRSG TEGADERM 4X4.75 (GAUZE/BANDAGES/DRESSINGS) ×2 IMPLANT
DURAPREP 26ML APPLICATOR (WOUND CARE) ×4 IMPLANT
ELECT CAUTERY BLADE 6.4 (BLADE) ×2 IMPLANT
ELECT REM PT RETURN 9FT ADLT (ELECTROSURGICAL) ×2
ELECTRODE REM PT RTRN 9FT ADLT (ELECTROSURGICAL) ×1 IMPLANT
EX-PIN ORTHOLOCK NAV 4X150 (PIN) ×4 IMPLANT
GLOVE BIOGEL M STRL SZ7.5 (GLOVE) ×6 IMPLANT
GLOVE INDICATOR 8.0 STRL GRN (GLOVE) ×4 IMPLANT
GLOVE SURG 9.0 ORTHO LTXF (GLOVE) ×4 IMPLANT
GLOVE SURG ORTHO 9.0 STRL STRW (GLOVE) ×4 IMPLANT
GOWN STRL REUS W/ TWL LRG LVL3 (GOWN DISPOSABLE) ×2 IMPLANT
GOWN STRL REUS W/TWL 2XL LVL3 (GOWN DISPOSABLE) ×2 IMPLANT
GOWN STRL REUS W/TWL LRG LVL3 (GOWN DISPOSABLE) ×2
HANDPIECE INTERPULSE COAX TIP (DISPOSABLE) ×1
HOLDER FOLEY CATH W/STRAP (MISCELLANEOUS) ×2 IMPLANT
HOOD PEEL AWAY FLYTE STAYCOOL (MISCELLANEOUS) ×4 IMPLANT
KIT RM TURNOVER STRD PROC AR (KITS) ×2 IMPLANT
KNIFE SCULPS 14X20 (INSTRUMENTS) ×2 IMPLANT
LABEL OR SOLS (LABEL) ×2 IMPLANT
NDL SAFETY 18GX1.5 (NEEDLE) ×2 IMPLANT
NEEDLE SPNL 20GX3.5 QUINCKE YW (NEEDLE) ×2 IMPLANT
NS IRRIG 500ML POUR BTL (IV SOLUTION) ×2 IMPLANT
PACK TOTAL KNEE (MISCELLANEOUS) ×2 IMPLANT
PAD WRAPON POLAR KNEE (MISCELLANEOUS) ×1 IMPLANT
PIN DRILL QUICK PACK ×2 IMPLANT
PIN FIXATION 1/8DIA X 3INL (PIN) ×2 IMPLANT
SET HNDPC FAN SPRY TIP SCT (DISPOSABLE) ×1 IMPLANT
SOL .9 NS 3000ML IRR  AL (IV SOLUTION) ×1
SOL .9 NS 3000ML IRR UROMATIC (IV SOLUTION) ×1 IMPLANT
SOL PREP PVP 2OZ (MISCELLANEOUS) ×2
SOLUTION PREP PVP 2OZ (MISCELLANEOUS) ×1 IMPLANT
SPONGE DRAIN TRACH 4X4 STRL 2S (GAUZE/BANDAGES/DRESSINGS) ×2 IMPLANT
STAPLER SKIN PROX 35W (STAPLE) ×2 IMPLANT
STOCKINETTE IMPERV 14X48 (MISCELLANEOUS) ×2 IMPLANT
SUCTION FRAZIER HANDLE 10FR (MISCELLANEOUS) ×1
SUCTION TUBE FRAZIER 10FR DISP (MISCELLANEOUS) ×1 IMPLANT
SUT VIC AB 0 CT1 36 (SUTURE) ×2 IMPLANT
SUT VIC AB 1 CT1 36 (SUTURE) ×4 IMPLANT
SUT VIC AB 2-0 CT2 27 (SUTURE) ×2 IMPLANT
SYR 20CC LL (SYRINGE) ×2 IMPLANT
SYR 30ML LL (SYRINGE) ×4 IMPLANT
SYSTEM AUTOTRANSFUS DUAL TROCR (MISCELLANEOUS) ×1 IMPLANT
TOWEL OR 17X26 4PK STRL BLUE (TOWEL DISPOSABLE) ×2 IMPLANT
TOWER CARTRIDGE SMART MIX (DISPOSABLE) ×2 IMPLANT
WRAPON POLAR PAD KNEE (MISCELLANEOUS) ×2

## 2016-12-23 NOTE — Op Note (Signed)
OPERATIVE NOTE  DATE OF SURGERY:  12/23/2016  PATIENT NAME:  [name]   DOB: 10-16-1961  MRN: [id number]  PRE-OPERATIVE DIAGNOSIS: Degenerative arthrosis of the left knee, primary  POST-OPERATIVE DIAGNOSIS:  Same  PROCEDURE:  Left total knee arthroplasty using computer-assisted navigation  SURGEON:  Jena Gauss. M.D.  ASSISTANT:  Van Clines, PA (present and scrubbed throughout the case, critical for assistance with exposure, retraction, instrumentation, and closure)  ANESTHESIA: spinal  ESTIMATED BLOOD LOSS: 50 mL  FLUIDS REPLACED: 1200 mL of crystalloid  TOURNIQUET TIME: 99 minutes  DRAINS: 2 medium drains to a reinfusion system  SOFT TISSUE RELEASES: Anterior cruciate ligament, posterior cruciate ligament, deep and superficial medial collateral ligament, patellofemoral ligament  IMPLANTS UTILIZED: DePuy PFC Sigma size 4N posterior stabilized femoral component (cemented), size 4 MBT tibial component (cemented), 38 mm 3 peg oval dome patella (cemented), and a 15 mm stabilized rotating platform polyethylene insert.  INDICATIONS FOR SURGERY: [name] is a 56 y.o. year old female with a long history of progressive knee pain. X-rays demonstrated severe degenerative changes in tricompartmental fashion. The patient had not seen any significant improvement despite conservative nonsurgical intervention. After discussion of the risks and benefits of surgical intervention, the patient expressed understanding of the risks benefits and agree with plans for total knee arthroplasty.   The risks, benefits, and alternatives were discussed at length including but not limited to the risks of infection, bleeding, nerve injury, stiffness, blood clots, the need for revision surgery, cardiopulmonary complications, among others, and they were willing to proceed.  PROCEDURE IN DETAIL: The patient was brought into the operating room and, after adequate spinal anesthesia was achieved, a  tourniquet was placed on the patient's upper thigh. The patient's knee and leg were cleaned and prepped with alcohol and DuraPrep and draped in the usual sterile fashion. A "timeout" was performed as per usual protocol. The lower extremity was exsanguinated using an Esmarch, and the tourniquet was inflated to 300 mmHg. An anterior longitudinal incision was made followed by a standard mid vastus approach. The deep fibers of the medial collateral ligament were elevated in a subperiosteal fashion off of the medial flare of the tibia so as to maintain a continuous soft tissue sleeve. The patella was subluxed laterally and the patellofemoral ligament was incised. Inspection of the knee demonstrated severe degenerative changes with full-thickness loss of articular cartilage. Osteophytes were debrided using a rongeur. Anterior and posterior cruciate ligaments were excised. Two 4.0 mm Schanz pins were inserted in the femur and into the tibia for attachment of the array of trackers used for computer-assisted navigation. Hip center was identified using a circumduction technique. Distal landmarks were mapped using the computer. The distal femur and proximal tibia were mapped using the computer. The distal femoral cutting guide was positioned using computer-assisted navigation so as to achieve a 5 distal valgus cut. The femur was sized and it was felt that a size 4N femoral component was appropriate. A size 4 femoral cutting guide was positioned and the anterior cut was performed and verified using the computer. This was followed by completion of the posterior and chamfer cuts. Femoral cutting guide for the central box was then positioned in the center box cut was performed.  Attention was then directed to the proximal tibia. Medial and lateral menisci were excised. The extramedullary tibial cutting guide was positioned using computer-assisted navigation so as to achieve a 0 varus-valgus alignment and 0 posterior slope.  The cut was performed and  verified using the computer. The proximal tibia was sized and it was felt that a size 4 tibial tray was appropriate. Tibial and femoral trials were inserted followed by insertion of a 10 mm polyethylene insert. The knee was felt to be tight medially. The knee was felt to be tight medially. A Cobb elevator was used to elevate the superficial fibers of the medial collateral ligament.  The 10 mm polyethylene trial was replaced by a 15 mm polyethylene insert. This allowed for excellent mediolateral soft tissue balancing both in flexion and in full extension. Finally, the patella was cut and prepared so as to accommodate a 38 mm 3 peg oval dome patella. A patella trial was placed and the knee was placed through a range of motion with excellent patellar tracking appreciated. The femoral trial was removed after debridement of posterior osteophytes. The central post-hole for the tibial component was reamed followed by insertion of a keel punch. Tibial trials were then removed. Cut surfaces of bone were irrigated with copious amounts of normal saline with antibiotic solution using pulsatile lavage and then suctioned dry. Polymethylmethacrylate cement with gentamicin was prepared in the usual fashion using a vacuum mixer. Cement was applied to the cut surface of the proximal tibia as well as along the undersurface of a size 4 MBT tibial component. Tibial component was positioned and impacted into place. Excess cement was removed using Personal assistantreer elevators. Cement was then applied to the cut surfaces of the femur as well as along the posterior flanges of the size 4N femoral component. The femoral component was positioned and impacted into place. Excess cement was removed using Personal assistantreer elevators. A 15 mm polyethylene trial was inserted and the knee was brought into full extension with steady axial compression applied. Finally, cement was applied to the backside of a 38 mm 3 peg oval dome patella and the  patellar component was positioned and patellar clamp applied. Excess cement was removed using Personal assistantreer elevators. After adequate curing of the cement, the tourniquet was deflated after a total tourniquet time of 99 minutes. Hemostasis was achieved using electrocautery. The knee was irrigated with copious amounts of normal saline with antibiotic solution using pulsatile lavage and then suctioned dry. 20 mL of 1.3% Exparel and 60 mL of 0.25% Marcaine in 40 mL of normal saline was injected along the posterior capsule, medial and lateral gutters, and along the arthrotomy site. A 15 mm stabilized rotating platform polyethylene insert was inserted and the knee was placed through a range of motion with excellent mediolateral soft tissue balancing appreciated and excellent patellar tracking noted. 2 medium drains were placed in the wound bed and brought out through separate stab incisions to be attached to a reinfusion system. The medial parapatellar portion of the incision was reapproximated using interrupted sutures of #1 Vicryl. Subcutaneous tissue was approximated in layers using first #0 Vicryl followed #2-0 Vicryl. The skin was approximated with skin staples. A sterile dressing was applied.  The patient tolerated the procedure well and was transported to the recovery room in stable condition.    Olivia Royse P. Angie FavaHooten, Jr., M.D.

## 2016-12-23 NOTE — [place] FL2 (Signed)
[place] MEDICAID FL2 LEVEL OF CARE SCREENING TOOL     IDENTIFICATION  Patient Name: [name] Birthdate: [birth year] Sex: female Admission Date (Current Location): 12/23/2016  [place] and [place] Number:  Chiropodist and Address:  [facility], [address], [place], [place] [address]      Provider Number: [id number]  Attending Physician Name and Address:  Donato Heinz, MD  Relative Name and Phone Number:       Current Level of Care: Hospital Recommended Level of Care: Skilled Nursing Facility Prior Approval Number:    Date Approved/Denied:   PASRR Number:  [id number])  Discharge Plan: SNF    Current Diagnoses: Patient Active Problem List   Diagnosis Date Noted  . S/P total knee arthroplasty 12/23/2016  . Primary osteoarthritis of left knee 11/11/2016  . Status post total right knee replacement 11/11/2016  . Venous stasis 12/18/2015  . HTN, goal below 140/80 12/18/2015    Orientation RESPIRATION BLADDER Height & Weight     Self, Time, Situation, Place  Normal Continent Weight: 225 lb (102.1 kg) Height:  5\' 9"  (175.3 cm)  BEHAVIORAL SYMPTOMS/MOOD NEUROLOGICAL BOWEL NUTRITION STATUS   (none)  (none) Continent Diet (Diet: Clear Liquid )  AMBULATORY STATUS COMMUNICATION OF NEEDS Skin   Extensive Assist Verbally Surgical wounds (Incision: Left Knee )                       Personal Care Assistance Level of Assistance  Bathing, Feeding, Dressing Bathing Assistance: Limited assistance Feeding assistance: Independent Dressing Assistance: Limited assistance     Functional Limitations Info  Sight, Hearing, Speech Sight Info: Adequate Hearing Info: Adequate Speech Info: Adequate    SPECIAL CARE FACTORS FREQUENCY  PT (By licensed PT), OT (By licensed OT)     PT Frequency:  (5) OT Frequency:  (5)            Contractures      Additional Factors Info  Code Status, Allergies Code Status Info:  (Full Code.  ) Allergies Info:  (Cefaclor, Naproxen)           Current Medications (12/23/2016):  This is the current hospital active medication list Current Facility-Administered Medications  Medication Dose Route Frequency Provider Last Rate Last Dose  . 0.9 %  sodium chloride infusion   Intravenous Continuous Donato Heinz, MD 100 mL/hr at 12/23/16 1349    . acetaminophen (OFIRMEV) IV 1,000 mg  1,000 mg Intravenous Q6H Donato Heinz, MD      . acetaminophen (TYLENOL) tablet 650 mg  650 mg Oral Q6H PRN Donato Heinz, MD       Or  . acetaminophen (TYLENOL) suppository 650 mg  650 mg Rectal Q6H PRN Donato Heinz, MD      . alum & mag hydroxide-simeth (MAALOX/MYLANTA) 200-200-20 MG/5ML suspension 30 mL  30 mL Oral Q4H PRN Donato Heinz, MD      . amLODipine (NORVASC) tablet 5 mg  5 mg Oral QHS Donato Heinz, MD      . bisacodyl (DULCOLAX) suppository 10 mg  10 mg Rectal Daily PRN Donato Heinz, MD      . celecoxib (CELEBREX) capsule 200 mg  200 mg Oral Q12H Donato Heinz, MD      . clindamycin (CLEOCIN) IVPB 600 mg  600 mg Intravenous Q6H Donato Heinz, MD      . diphenhydrAMINE (BENADRYL) 12.5 MG/5ML elixir 12.5-25 mg  12.5-25  mg Oral Q4H PRN Donato HeinzJames P Hooten, MD      . Melene Muller[START ON 12/24/2016] enoxaparin (LOVENOX) injection 30 mg  30 mg Subcutaneous Q12H Donato HeinzJames P Hooten, MD      . ferrous sulfate tablet 325 mg  325 mg Oral BID WC Donato HeinzJames P Hooten, MD      . losartan (COZAAR) tablet 100 mg  100 mg Oral QHS Donato HeinzJames P Hooten, MD       And  . hydrochlorothiazide (HYDRODIURIL) tablet 25 mg  25 mg Oral QHS Donato HeinzJames P Hooten, MD      . Melene Muller[START ON 12/24/2016] loratadine (CLARITIN) tablet 10 mg  10 mg Oral Daily Donato HeinzJames P Hooten, MD      . magnesium hydroxide (MILK OF MAGNESIA) suspension 30 mL  30 mL Oral Daily PRN Donato HeinzJames P Hooten, MD      . menthol-cetylpyridinium (CEPACOL) lozenge 3 mg  1 lozenge Oral PRN Donato HeinzJames P Hooten, MD       Or  . phenol (CHLORASEPTIC) mouth spray 1 spray  1 spray Mouth/Throat PRN Donato HeinzJames P  Hooten, MD      . metoCLOPramide (REGLAN) tablet 10 mg  10 mg Oral TID AC & HS Donato HeinzJames P Hooten, MD      . morphine 2 MG/ML injection 2 mg  2 mg Intravenous Q2H PRN Donato HeinzJames P Hooten, MD      . ondansetron (ZOFRAN) tablet 4 mg  4 mg Oral Q6H PRN Donato HeinzJames P Hooten, MD       Or  . ondansetron (ZOFRAN) injection 4 mg  4 mg Intravenous Q6H PRN Donato HeinzJames P Hooten, MD   4 mg at 12/23/16 1348  . oxyCODONE (Oxy IR/ROXICODONE) immediate release tablet 5-10 mg  5-10 mg Oral Q4H PRN Donato HeinzJames P Hooten, MD   5 mg at 12/23/16 1354  . pantoprazole (PROTONIX) EC tablet 40 mg  40 mg Oral BID Donato HeinzJames P Hooten, MD      . potassium chloride SA (K-DUR,KLOR-CON) CR tablet 20 mEq  20 mEq Oral QHS Donato HeinzJames P Hooten, MD      . senna-docusate (Senokot-S) tablet 1 tablet  1 tablet Oral BID Donato HeinzJames P Hooten, MD      . sodium phosphate (FLEET) 7-19 GM/118ML enema 1 enema  1 enema Rectal Once PRN Donato HeinzJames P Hooten, MD      . traMADol Janean Sark(ULTRAM) tablet 50-100 mg  50-100 mg Oral Q4H PRN Donato HeinzJames P Hooten, MD         Discharge Medications: Please see discharge summary for a list of discharge medications.  Relevant Imaging Results:  Relevant Lab Results:   Additional Information  (SSN: [id number])  [place] Fant, Darleen CrockerBailey M, LCSW

## 2016-12-23 NOTE — Brief Op Note (Signed)
12/23/2016  11:52 AM  PATIENT:  Pearletha FurlKaren M Mcpeters  56 y.o. female  PRE-OPERATIVE DIAGNOSIS:  PRIMARY OSTEOARTHRITIS OF LEFT KNEE  POST-OPERATIVE DIAGNOSIS:  PRIMARY OSTEOARTHRITIS OF LEFT KNEE  PROCEDURE:  Procedure(s): COMPUTER ASSISTED TOTAL KNEE ARTHROPLASTY (Left)  SURGEON:  Surgeon(s) and Role:    * Donato HeinzJames P Hooten, MD - Primary  ASSISTANTS: Van ClinesJon Wolfe, PA   ANESTHESIA:   spinal  EBL:  Total I/O In: 1200 [I.V.:1200] Out: 850 [Urine:800; Blood:50]  BLOOD ADMINISTERED:none  DRAINS: 2 medium drains to a reinfusion system   LOCAL MEDICATIONS USED:  MARCAINE    and OTHER Exparel  SPECIMEN:  No Specimen  DISPOSITION OF SPECIMEN:  N/A  COUNTS:  YES  TOURNIQUET:    DICTATION: .Dragon Dictation  PLAN OF CARE: Admit to inpatient   PATIENT DISPOSITION:  PACU - hemodynamically stable.   Delay start of Pharmacological VTE agent (>24hrs) due to surgical blood loss or risk of bleeding: yes

## 2016-12-23 NOTE — H&P (Signed)
The patient has been re-examined, and the chart reviewed, and there have been no interval changes to the documented history and physical.    The risks, benefits, and alternatives have been discussed at length. The patient expressed understanding of the risks benefits and agreed with plans for surgical intervention.  James P. Hooten, Jr. M.D.    

## 2016-12-23 NOTE — Progress Notes (Signed)
Patient was admitted to room 155 from surgery, via room bed. Foley, cyro cuff, autovac, TEDs,and foot pumps in place. A&O x4. Started IV fluids. Bed alarm on for safety. Reviewed admin order and POC.

## 2016-12-23 NOTE — Transfer of Care (Signed)
Immediate Anesthesia Transfer of Care Note  Patient: [name]  Procedure(s) Performed: Procedure(s): COMPUTER ASSISTED TOTAL KNEE ARTHROPLASTY (Left)  Patient Location: PACU  Anesthesia Type:Spinal  Level of Consciousness: awake, alert , oriented and patient cooperative  Airway & Oxygen Therapy: Patient Spontanous Breathing and Patient connected to nasal cannula oxygen  Post-op Assessment: Report given to RN and Post -op Vital signs reviewed and stable  Post vital signs: Reviewed and stable  Last Vitals:  Vitals:   12/23/16 0609  BP: (!) 176/104  Pulse: 76  Resp: 18  Temp: 36.5 C    Last Pain:  Vitals:   12/23/16 0609  TempSrc: Oral  PainSc: 8          Complications: No apparent anesthesia complications

## 2016-12-23 NOTE — Anesthesia Procedure Notes (Signed)
Spinal  Patient location during procedure: OR Start time: 12/23/2016 7:53 AM End time: 12/23/2016 7:58 AM Staffing Anesthesiologist: Yves DillARROLL, Lonzie Simmer Performed: anesthesiologist  Preanesthetic Checklist Completed: patient identified, site marked, surgical consent, pre-op evaluation, timeout performed, IV checked, risks and benefits discussed and monitors and equipment checked Spinal Block Patient position: sitting Prep: Betadine and site prepped and draped Patient monitoring: heart rate, cardiac monitor, continuous pulse ox and blood pressure Approach: right paramedian Location: L3-4 Injection technique: single-shot Needle Needle type: Whitacre  Needle gauge: 25 G Needle length: 9 cm Assessment Sensory level: T8 Additional Notes Time out called.  Patient placed in sitting position.  Back prepped and draped in sterile fashion.  A skin wheal was made in the right para- median location between L3-L4 interspace with 1% Lidocaine plain .   A 25 G Whitacre needle was inserted with the return of clear, colorless CSF in all 4 quadrants.  No blood or paresthesias.  Patient tolerated the procedure well.

## 2016-12-23 NOTE — Anesthesia Post-op Follow-up Note (Cosign Needed)
Anesthesia QCDR form completed.        

## 2016-12-23 NOTE — Evaluation (Signed)
Physical Therapy Evaluation Patient Details Name: Amy FurlKaren M Norton MRN: [id number] DOB: 05/02/1961 Today's Date: 12/23/2016   History of Present Illness  Pt is a pleasant 56 yo female, s/p L TKR   Clinical Impression  Pt was awake, alert, responsive to commands and demonstrated good safety awareness throughout PT eval. Stated her pain was 8/10 and received pain meds 20 minutes before starting session. Overall UE and R LE strength and ROM are WFLs. Pt displays decreased ROM for L Knee (8-93 AAROM) and decreased L LE strength; grossly at least 3+/5, she was able to actively perform SLR of the L LE and does not require a knee immobilizer. Pt demonstrated good mobility and able to move from supine to sitting w/ modified independence and transfer to standing w/ RW and min guarding for increased stability. She ambulated to chair using a RW and min guarding and took small steps w/ decreased stance time on the L LE due to pain. During ambulation her knee did not buckle and there was no LOB. Overall patient displays decreased strength and ROM and increased pain in L LE that limit functional mobility. She will benefit from skilled PT to correct deficits and recommend pt receive HHPT following acute hospitalization.     Follow Up Recommendations Home health PT    Equipment Recommendations  3in1 (PT) (pt has RW from previous knee surgery)    Recommendations for Other Services       Precautions / Restrictions Precautions Precautions: Fall Restrictions Weight Bearing Restrictions: Yes LLE Weight Bearing: Weight bearing as tolerated Other Position/Activity Restrictions: Able to actively perform SLR no need for knee immobilizer      Mobility  Bed Mobility Overal bed mobility: Modified Independent             General bed mobility comments: able to transfer from supine to sitting w/ increased time, use of bed rails and HOB elevated  Transfers Overall transfer level: Needs assistance Equipment  used: Rolling walker (2 wheeled) Transfers: Sit to/from Stand Sit to Stand: Min guard         General transfer comment: cuing for hand placement, L LE slightly anterior to R LE, bilateral UE use for push off  Ambulation/Gait Ambulation/Gait assistance: Min guard Ambulation Distance (Feet): 3 Feet Assistive device: Rolling walker (2 wheeled) Gait Pattern/deviations: Step-to pattern;Decreased stance time - left;Decreased step length - right     General Gait Details: walked to chair w/ step to pattern secondary to pain on L LE, able to use RW safely w/ no buckling of the L knee or LOB, no signs of nausea or dizziness  Stairs            Wheelchair Mobility    Modified Rankin (Stroke Patients Only)       Balance Overall balance assessment: Needs assistance Sitting-balance support: Feet supported Sitting balance-Leahy Scale: Good Sitting balance - Comments: able to maintain upright posture w/ supervision,    Standing balance support: Bilateral upper extremity supported Standing balance-Leahy Scale: Good Standing balance comment: RW for additional stability, slightly flexed L knee in stance                             Pertinent Vitals/Pain Pain Assessment: 0-10 Pain Score: 8  Pain Intervention(s): Monitored during session;Premedicated before session;Repositioned    Home Living Family/patient expects to be discharged to:: Private residence Living Arrangements: Alone Available Help at Discharge: Family (sister is retired and lives 5 mins  away) Type of Home: House Home Access: Stairs to enter   [employer] of Steps: 1 Home Layout: Two level;Full bath on main level Home Equipment: Walker - 2 wheels;Cane - single point      Prior Function Level of Independence: Independent         Comments: Independent in ADLs, works part time at ICU and part time for [employer]        Extremity/Trunk Assessment   Upper Extremity  Assessment Upper Extremity Assessment: Overall WFL for tasks assessed    Lower Extremity Assessment Lower Extremity Assessment: LLE deficits/detail LLE Deficits / Details: grossly at least 3+/5       Communication   Communication: No difficulties  Cognition Arousal/Alertness: Awake/alert Behavior During Therapy: WFL for tasks assessed/performed Overall Cognitive Status: Within Functional Limits for tasks assessed                      General Comments      Exercises Total Joint Exercises Ankle Circles/Pumps: AROM;Both;15 reps;Supine;Strengthening Quad Sets: AROM;Left;10 reps;Supine;Strengthening Gluteal Sets: AROM;Strengthening;Both;10 reps;Supine Heel Slides: AROM;Strengthening;Left;10 reps;Supine Hip ABduction/ADduction: AROM;Strengthening;Left;Supine Straight Leg Raises: AROM;Strengthening;Left;10 reps Goniometric ROM: 8-93 active assist ROM for left knee   Assessment/Plan    PT Assessment Patient needs continued PT services  PT Problem List Decreased strength;Decreased range of motion;Decreased balance;Decreased mobility;Decreased knowledge of use of DME          PT Treatment Interventions DME instruction;Gait training;Stair training;Functional mobility training;Balance training;Therapeutic exercise;Therapeutic activities    PT Goals (Current goals can be found in the Care Plan section)  Acute Rehab PT Goals Patient Stated Goal: Return Home PT Goal Formulation: With patient Time For Goal Achievement: 01/06/17 Potential to Achieve Goals: Good    Frequency BID   Barriers to discharge Inaccessible home environment      Co-evaluation               End of Session Equipment Utilized During Treatment: Gait belt Activity Tolerance: Patient tolerated treatment well Patient left: in chair;with call bell/phone within reach;with chair alarm set;with SCD's reapplied;Other (comment) (towel rolls under feet and Ice machine on) Nurse Communication: Mobility  status         Time: 1500-1530 PT Time Calculation (min) (ACUTE ONLY): 30 min   Charges:         PT G Codes:        [employer]  Student PT 12/23/2016, 4:04 PM

## 2016-12-23 NOTE — Anesthesia Preprocedure Evaluation (Signed)
Anesthesia Evaluation  Patient identified by MRN, date of birth, ID band Patient awake    Reviewed: Allergy & Precautions, NPO status , Patient's Chart, lab work & pertinent test results  Airway Mallampati: II  TM Distance: >3 FB     Dental no notable dental hx.    Pulmonary Current Smoker,    Pulmonary exam normal        Cardiovascular hypertension, Pt. on medications Normal cardiovascular exam     Neuro/Psych negative neurological ROS  negative psych ROS   GI/Hepatic negative GI ROS, Neg liver ROS,   Endo/Other  negative endocrine ROS  Renal/GU negative Renal ROS  negative genitourinary   Musculoskeletal  (+) Arthritis , Osteoarthritis,    Abdominal Normal abdominal exam  (+)   Peds negative pediatric ROS (+)  Hematology negative hematology ROS (+)   Anesthesia Other Findings   Reproductive/Obstetrics                             Anesthesia Physical Anesthesia Plan  ASA: II  Anesthesia Plan: Spinal   Post-op Pain Management:    Induction: Intravenous  Airway Management Planned: Nasal Cannula  Additional Equipment:   Intra-op Plan:   Post-operative Plan:   Informed Consent: I have reviewed the patients History and Physical, chart, labs and discussed the procedure including the risks, benefits and alternatives for the proposed anesthesia with the patient or authorized representative who has indicated his/her understanding and acceptance.   Dental advisory given  Plan Discussed with: CRNA and Surgeon  Anesthesia Plan Comments:         Anesthesia Quick Evaluation

## 2016-12-24 LAB — BASIC METABOLIC PANEL
ANION GAP: 8 (ref 5–15)
BUN: 20 mg/dL (ref 6–20)
CHLORIDE: 104 mmol/L (ref 101–111)
CO2: 22 mmol/L (ref 22–32)
Calcium: 9 mg/dL (ref 8.9–10.3)
Creatinine, Ser: 1.18 mg/dL — ABNORMAL HIGH (ref 0.44–1.00)
GFR calc Af Amer: 59 mL/min — ABNORMAL LOW (ref >60)
GFR calc non Af Amer: 51 mL/min — ABNORMAL LOW (ref >60)
GLUCOSE: 136 mg/dL — AB (ref 65–99)
POTASSIUM: 3.9 mmol/L (ref 3.5–5.1)
Sodium: 134 mmol/L — ABNORMAL LOW (ref 135–145)

## 2016-12-24 LAB — CBC
HEMATOCRIT: 34.5 % — AB (ref 35.0–47.0)
Hemoglobin: 11.9 g/dL — ABNORMAL LOW (ref 12.0–16.0)
MCH: 32.4 pg (ref 26.0–34.0)
MCHC: 34.6 g/dL (ref 32.0–36.0)
MCV: 93.6 fL (ref 80.0–100.0)
Platelets: 274 10*3/uL (ref 150–440)
RBC: 3.68 MIL/uL — AB (ref 3.80–5.20)
RDW: 13.5 % (ref 11.5–14.5)
WBC: 7.7 10*3/uL (ref 3.6–11.0)

## 2016-12-24 NOTE — Progress Notes (Signed)
Paged MD/PA r/t "popping sound" from patient's operative knee, stated this sound is "normal" on this day.

## 2016-12-24 NOTE — Evaluation (Signed)
Occupational Therapy Evaluation Patient Details Name: [name] MRN: [id number] DOB: [birth year] Today's Date: 12/24/2016    History of Present Illness Pt is a pleasant 56 yo female, s/p L TKR with PMHx significant for HTN. R TKR performed approx 5 years ago with no complications.   Clinical Impression   Pt seen for OT evaluation this date. 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 Pt presents with L knee pain, decreased strength/ROM/activity tolerance/knowledge of AE for ADL with need for skilled OT services to address noted impairments and functional deficits including min assist for LB ADL and cueing for safety with functional mobility. Treatment focus on continued AE training/education for LB ADL, falls prevention and additional ECS education/training to maximize functional independence with ADL and minimize falls risk in order to return to PLOF. Pt is eager to return home after hospitalization. Of note, pt had R TKR performed approximately 5 years ago, per pt report, and went home directly after with [facility] services. No difficulties noted and was able to manage stairs at home. Pt feels confident in return home after this hospitalization with [facility] services again. OT recommending HHOT and 3:1 BSC.     Follow Up Recommendations  Home health OT    Equipment Recommendations  3 in 1 bedside commode    Recommendations for Other Services       Precautions / Restrictions Precautions Precautions: Fall Restrictions Weight Bearing Restrictions: Yes LLE Weight Bearing: Weight bearing as tolerated      Mobility Bed Mobility Overal bed mobility: Modified Independent             General bed mobility comments: performed supine to sit EOB with slightly increased time and HOB elevated but no physical assist required  Transfers Overall transfer level: Needs  assistance Equipment used: Rolling walker (2 wheeled) Transfers: Sit to/from Stand Sit to Stand: Min guard         General transfer comment: verbal cues for hand placement to push up from bed/toilet versus pulling up with RW to improve safety/technique    Balance Overall balance assessment: Needs assistance Sitting-balance support: Feet supported;No upper extremity supported Sitting balance-Leahy Scale: Good     Standing balance support: Bilateral upper extremity supported;During functional activity Standing balance-Leahy Scale: Good Standing balance comment: bilat UE supported on RW                            ADL Overall ADL's : Needs assistance/impaired Eating/Feeding: Sitting;Set up   Grooming: Wash/dry hands;Standing;Supervision/safety;Cueing for safety Grooming Details (indicate cue type and reason): Pt washed hands at sink after toileting task with supervision and verbal cues for safety for not leaving the RW to the side when approaching the sink Upper Body Bathing: Sitting;Set up   Lower Body Bathing: Sitting/lateral leans;Sit to/from stand;Minimal assistance;With adaptive equipment   Upper Body Dressing : Set up;Sitting   Lower Body Dressing: Set up;Minimal assistance;With adaptive equipment;Sitting/lateral leans;Sit to/from stand Lower Body Dressing Details (indicate cue type and reason): pt educated in use of reacher and sock aid for LB ADL tasks Toilet Transfer: Min Nurse, learning disability Details (indicate cue type and reason): Pt performed toilet transfer to [facility] over toilet using RW for ambulation and verbal cues for hand placement and RW safety Toileting- Clothing Manipulation and Hygiene: Modified independent;Sit to/from stand  Functional mobility during ADLs: Min guard;Cueing for safety;Rolling walker General ADL Comments: Pt generally min assist with ocassional verbal cues for RW safety during functional mobility and self care tasks to  minimize falls risk     Vision Vision Assessment?: No apparent visual deficits   Perception     Praxis Praxis Praxis tested?: Within functional limits    Pertinent Vitals/Pain Pain Assessment: 0-10 Pain Score: 6  Pain Location: L knee at end of session after functional mobility and self care tasks; 3/10 in L knee at rest at start of session Pain Intervention(s): Limited activity within patient's tolerance;Monitored during session;Premedicated before session;Repositioned;Ice applied     Hand Dominance     Extremity/Trunk Assessment Upper Extremity Assessment Upper Extremity Assessment: Overall WFL for tasks assessed   Lower Extremity Assessment Lower Extremity Assessment: Defer to PT evaluation;LLE deficits/detail   Cervical / Trunk Assessment Cervical / Trunk Assessment: Normal   Communication Communication Communication: No difficulties   Cognition Arousal/Alertness: Awake/alert Behavior During Therapy: WFL for tasks assessed/performed Overall Cognitive Status: Within Functional Limits for tasks assessed                     General Comments       Exercises   Other Exercises Other Exercises: Pt educated in energy conservation strategies, proper footwear, and home/routines modifications to support safe return home and minimize falls risk    Shoulder Instructions      Home Living Family/patient expects to be discharged to:: Private residence Living Arrangements: Alone Available Help at Discharge: Family;Available 24 hours/day (sister lives 5 minutes away, retired) Type of Home: House Home Access: Stairs to enter Secretary/administratorntrance Stairs-Number of Steps: 1 Entrance Stairs-Rails: None Home Layout: Two level;1/2 bath on main level;Bed/bath upstairs Alternate Level Stairs-Number of Steps: 15 Alternate Level Stairs-Rails: Right Bathroom Shower/Tub: Walk-in shower;Door   Foot LockerBathroom Toilet: Standard Bathroom Accessibility: Yes How Accessible: Accessible via  wheelchair;Accessible via walker Home Equipment: Walker - 2 wheels;Cane - single point;Grab bars - tub/shower          Prior Functioning/Environment Level of Independence: Independent        Comments: Independent in ADLs, IADL, and driving; works part time at ICU and full time for [employer]; loves to travel and has trip planned for [place] in July with sister        OT Problem List: Pain;Decreased range of motion;Decreased safety awareness;Decreased knowledge of use of DME or AE;Decreased activity tolerance   OT Treatment/Interventions: Self-care/ADL training;Energy conservation;Patient/family education;DME and/or AE instruction;Therapeutic activities;Therapeutic exercise    OT Goals(Current goals can be found in the care plan section) Acute Rehab OT Goals Patient Stated Goal: Return Home OT Goal Formulation: With patient Time For Goal Achievement: 12/31/16 Potential to Achieve Goals: Good  OT Frequency: Min 1X/week   Barriers to D/C: Other (comment)  bedroom and full bath on 2nd floor       Co-evaluation              End of Session Equipment Utilized During Treatment: Gait belt;Rolling walker  Activity Tolerance: Patient tolerated treatment well Patient left: in bed;with call bell/phone within reach;with SCD's reapplied;Other (comment) (polar care reapplied)   Time: [id number] OT Time Calculation (min): 45 min Charges:  OT General Charges $OT Visit: 1 Procedure OT Evaluation $OT Eval Low Complexity: 1 Procedure OT Treatments $Self Care/Home Management : 23-37 mins G-Codes:    Eliezer BottomJamie L Stiller, OTR/L 12/24/2016, 10:36 AM

## 2016-12-24 NOTE — Care Management Note (Signed)
Case Management Note  Patient Details  Name: [name] MRN: [id number] Date of Birth: 1961/09/02  Subjective/Objective:  POD # 1 left TKA. Met with patient at bedside to discuss discharge planning. Patient denies issues paying for medications. PCP is  Dr. Doy Hutching. Presented list of home health agencies. Referral to [facility] for HHPT. Pharmacy:  [facility]. [facility] [phone] . Called Lovenox 40 mg #14, no refills. Patient has a walker and a cane. She will need a 3 in 1. Ordered from [place] with [facility]. Patient agreeable to POC.               Action/Plan:   Expected Discharge Date:  12/25/16               Expected Discharge Plan:  [place]  In-House Referral:     Discharge planning Services  CM Consult  Post Acute Care Choice:  Durable Medical Equipment, Home Health Choice offered to:  Patient  DME Arranged:  3-N-1 DME Agency:  [place]:  PT [place]:  [facility] (now [facility])  Status of Service:  In process, will continue to follow  If discussed at Long Length of Stay Meetings, dates discussed:    Additional Comments:  Jolly Mango, RN 12/24/2016, 2:07 PM

## 2016-12-24 NOTE — Progress Notes (Signed)
Clinical Social Worker (CSW) received SNF consult. PT is recommending home health. RN case manager aware of above. Please reconsult if future social work needs arise. CSW signing off.   Nichol Ator, LCSW [phone] 

## 2016-12-24 NOTE — Progress Notes (Signed)
Physical Therapy Treatment Patient Details Name: [name] MRN: [id number] DOB: 07/14/1961 Today's Date: 12/24/2016    History of Present Illness Pt is a pleasant 56 yo female, s/p L TKR with PMHx significant for HTN. R TKR performed approx 5 years ago with no complications.    PT Comments    Pt stated she was feeling okay this morning w/ pain of 4/10 and received pain meds prior to treatment. She demonstrated improve strength in functional tasks and able to perform bed mobility w/ modified independence and transfer w/ RW under PT supervision. Pt ambulated to nursing station and back to her room using a RW and CGA. She was able to progress more normal step through gait pattern w/ cuing, she displayed some pain during stance phase on L LE that limited stance time on the L. AAROM of L knee for flex/ext was  3/96 degrees.  Overall patient is progressing and displays strength, ROM and balance deficits as well as increased pain that limit functional mobility. She will continue to benefit from skilled PT and recommendation for HHPT remains the same.   Follow Up Recommendations  Home health PT     Equipment Recommendations  3in1 (PT)    Recommendations for Other Services       Precautions / Restrictions Precautions Precautions: Fall Restrictions Weight Bearing Restrictions: Yes LLE Weight Bearing: Weight bearing as tolerated Other Position/Activity Restrictions: Able to actively perform SLR no need for knee immobilizer    Mobility  Bed Mobility Overal bed mobility: Modified Independent             General bed mobility comments: performed supine to sit EOB with slightly increased time and HOB elevated but no physical assist required  Transfers Overall transfer level: Needs assistance Equipment used: Rolling walker (2 wheeled) Transfers: Sit to/from Stand Sit to Stand: Supervision         General transfer comment: demonstrated good hand placement, forwardly flexed during  transfer but able to correct to upright posture w/ RW for stability  Ambulation/Gait Ambulation/Gait assistance: Min guard Ambulation Distance (Feet): 60 Feet Assistive device: Rolling walker (2 wheeled) Gait Pattern/deviations: Step-through pattern;Decreased step length - right;Decreased stance time - left;Antalgic   Gait velocity interpretation: Below normal speed for age/gender General Gait Details: ambulated to nursing station and back, able to progress from step to pattern to step through pattern and improve stance time on L w cuing, ambulates w/ antalgic limp during stance of L LE   Stairs            Wheelchair Mobility    Modified Rankin (Stroke Patients Only)       Balance Overall balance assessment: Needs assistance Sitting-balance support: No upper extremity supported;Feet supported Sitting balance-Leahy Scale: Good Sitting balance - Comments: able to maintain upright posture w/ supervision,    Standing balance support: Bilateral upper extremity supported Standing balance-Leahy Scale: Good Standing balance comment: Rw for additional stability, no buckling or LOB                    Cognition Arousal/Alertness: Awake/alert Behavior During Therapy: WFL for tasks assessed/performed Overall Cognitive Status: Within Functional Limits for tasks assessed                      Exercises Total Joint Exercises Goniometric ROM: 3-96 active assist ROM for L knee Other Exercises Other Exercises: supine therex to improve strength for functional tasks; AROM 1x12 ankle pumps, quad sets,  Other Exercises:  seated therex to improve strength for functional tasks; AROM heel slides 1x10, marches 1x10, sit to stand x4, cuing for proper techinque     General Comments        Pertinent Vitals/Pain Pain Assessment: 0-10 Pain Score: 4  Pain Location: L knee Pain Descriptors / Indicators: Aching Pain Intervention(s): Premedicated before session;Monitored during  session;Limited activity within patient's tolerance    Home Living Family/patient expects to be discharged to:: Private residence Living Arrangements: Alone Available Help at Discharge: Family;Available 24 hours/day (sister lives 5 minutes away, retired) Type of Home: House Home Access: Stairs to enter Entrance Stairs-Rails: None Home Layout: Two level;1/2 bath on main level;Bed/bath upstairs Home Equipment: Walker - 2 wheels;Cane - single point;Grab bars - tub/shower      Prior Function Level of Independence: Independent      Comments: Independent in ADLs, IADL, and driving; works part time at ICU and full time for [employer]; loves to travel and has trip planned for [place] in July with sister   PT Goals (current goals can now be found in the care plan section) Acute Rehab PT Goals Patient Stated Goal: Return Home PT Goal Formulation: With patient Time For Goal Achievement: 01/06/17 Potential to Achieve Goals: Good Progress towards PT goals: Progressing toward goals    Frequency    BID      PT Plan Current plan remains appropriate    Co-evaluation             End of Session Equipment Utilized During Treatment: Gait belt Activity Tolerance: Patient tolerated treatment well Patient left: in chair;with call bell/phone within reach;with chair alarm set;with SCD's reapplied;Other (comment) (polar pack applied)     Time: 1050-1106 PT Time Calculation (min) (ACUTE ONLY): 16 min  Charges:                       G Codes:      [facility] [place] Student PT 12/24/2016, 11:22 AM

## 2016-12-24 NOTE — Discharge Instructions (Signed)

## 2016-12-24 NOTE — Progress Notes (Signed)
Physical Therapy Treatment Patient Details Name: LATIFFANY HARWICK MRN: [id number] DOB: 10/15/1961 Today's Date: 12/24/2016    History of Present Illness Pt is a pleasant 56 yo female, s/p L TKR with PMHx significant for HTN. R TKR performed approx 5 years ago with no complications.    PT Comments    Pt agreeable to PT; reports 3/10 pain in left knee. Pt requesting medication. Pt progressing well with all mobility with increased ambulation distance and negotiating up/down steps. Toward end of ambulation, pt/therapist noting a "popping" sound right as pt enters into left swing phase; sounds like when someone :"cracks a knuckle." Nursing in room to hear as well. Nursing to call surgeon. Pt returned to bed post session comfortably. Continue PT to progress strength, range and all functional mobility for a safe return home.   Follow Up Recommendations  Home health PT     Equipment Recommendations  3in1 (PT)    Recommendations for Other Services       Precautions / Restrictions Precautions Precautions: Knee;Fall Restrictions Weight Bearing Restrictions: Yes LLE Weight Bearing: Weight bearing as tolerated Other Position/Activity Restrictions: Able to actively perform SLR no need for knee immobilizer    Mobility  Bed Mobility Overal bed mobility: Needs Assistance Bed Mobility: Sit to Supine       Sit to supine: Min guard   General bed mobility comments: Hooklying tech  Transfers Overall transfer level: Needs assistance Equipment used: Rolling walker (2 wheeled) Transfers: Sit to/from Stand Sit to Stand: Supervision         General transfer comment: Safe hand placemnt; cues to use LLE as close to equal of RLE as possible  Ambulation/Gait Ambulation/Gait assistance: Min guard Ambulation Distance (Feet): 240 Feet Assistive device: Rolling walker (2 wheeled) Gait Pattern/deviations: Step-through pattern;Decreased step length - right;Decreased stance time - left;Antalgic Gait  velocity: decreased Gait velocity interpretation: Below normal speed for age/gender General Gait Details: Safe without LOB; progressing nicely   Stairs Stairs: Yes   Stair Management: One rail Right;Step to pattern Number of Stairs: 6 General stair comments: Sequences well once instructed of appropriate sequence  Wheelchair Mobility    Modified Rankin (Stroke Patients Only)       Balance Overall balance assessment: Needs assistance Sitting-balance support: No upper extremity supported;Feet supported Sitting balance-Leahy Scale: Good Sitting balance - Comments: able to maintain upright posture w/ supervision,    Standing balance support: Bilateral upper extremity supported Standing balance-Leahy Scale: Good Standing balance comment: Rw for additional stability, no buckling or LOB                    Cognition Arousal/Alertness: Awake/alert Behavior During Therapy: WFL for tasks assessed/performed Overall Cognitive Status: Within Functional Limits for tasks assessed                      Exercises Total Joint Exercises Knee Flexion: Left;10 reps;AROM;Seated (3 positions each rep) Goniometric ROM: 3-96 active assist ROM for L knee Other Exercises Other Exercises: Set up and distant supervision for toiletting Other Exercises: seated therex to improve strength for functional tasks; AROM heel slides 1x10, marches 1x10, sit to stand x4, cuing for proper techinque     General Comments        Pertinent Vitals/Pain Pain Assessment: 0-10 Pain Score: 3  Pain Location: L knee Pain Descriptors / Indicators: Aching Pain Intervention(s): Monitored during session;Ice applied;Repositioned;Patient requesting pain meds-RN notified    Home Living  Prior Function            PT Goals (current goals can now be found in the care plan section) Acute Rehab PT Goals Patient Stated Goal: Return Home PT Goal Formulation: With patient Time  For Goal Achievement: 01/06/17 Potential to Achieve Goals: Good Progress towards PT goals: Progressing toward goals    Frequency    BID      PT Plan Current plan remains appropriate    Co-evaluation             End of Session Equipment Utilized During Treatment: Gait belt Activity Tolerance: Patient tolerated treatment well Patient left: in bed;with call bell/phone within reach;with bed alarm set;with SCD's reapplied;Other (comment);with nursing/sitter in room (polar care in place)     Time: 1437-1510 PT Time Calculation (min) (ACUTE ONLY): 33 min  Charges:  $Gait Training: 8-22 mins $Therapeutic Exercise: 8-22 mins                    G Codes:      Scot DockHeidi E Barnes,  Pta 12/24/2016, 3:06 PM

## 2016-12-24 NOTE — Discharge Summary (Signed)
Physician Discharge Summary  Patient ID: [name] MRN: [id number] DOB/AGE: [birth year] 56 y.o.  Admit date: 12/23/2016 Discharge date: 12/25/2012  Admission Diagnoses:  PRIMARY OSTEOARTHRITIS OF LEFT KNEE   Discharge Diagnoses: Patient Active Problem List   Diagnosis Date Noted  . S/P total knee arthroplasty 12/23/2016  . Primary osteoarthritis of left knee 11/11/2016  . Status post total right knee replacement 11/11/2016  . Venous stasis 12/18/2015  . HTN, goal below 140/80 12/18/2015    Past Medical History:  Diagnosis Date  . Hypertension      Transfusion: No transfusions given doing this admission   Consultants (if any):  case management for home health assistance  Discharged Condition: Improved  Hospital Course: [name] is an 56 y.o. female who was admitted 12/23/2016 with a diagnosis of degenerative arthrosis left knee and went to the operating room on 12/23/2016 and underwent the above named procedures.    Surgeries:Procedure(s): COMPUTER ASSISTED TOTAL KNEE ARTHROPLASTY on 12/23/2016  PRE-OPERATIVE DIAGNOSIS: Degenerative arthrosis of the left knee, primary  POST-OPERATIVE DIAGNOSIS:  Same  PROCEDURE:  Left total knee arthroplasty using computer-assisted navigation  SURGEON:  Jena Gauss. M.D.  ASSISTANT:  Van Clines, PA (present and scrubbed throughout the case, critical for assistance with exposure, retraction, instrumentation, and closure)  ANESTHESIA: spinal  ESTIMATED BLOOD LOSS: 50 mL  FLUIDS REPLACED: 1200 mL of crystalloid  TOURNIQUET TIME: 99 minutes  DRAINS: 2 medium drains to a reinfusion system  SOFT TISSUE RELEASES: Anterior cruciate ligament, posterior cruciate ligament, deep and superficial medial collateral ligament, patellofemoral ligament  IMPLANTS UTILIZED: DePuy PFC Sigma size 4N posterior stabilized femoral component (cemented), size 4 MBT tibial component (cemented), 38 mm 3 peg oval dome patella  (cemented), and a 15 mm stabilized rotating platform polyethylene insert.  INDICATIONS FOR SURGERY: [name] is a 56 y.o. year old female with a long history of progressive knee pain. X-rays demonstrated severe degenerative changes in tricompartmental fashion. The patient had not seen any significant improvement despite conservative nonsurgical intervention. After discussion of the risks and benefits of surgical intervention, the patient expressed understanding of the risks benefits and agree with plans for total knee arthroplasty.   The risks, benefits, and alternatives were discussed at length including but not limited to the risks of infection, bleeding, nerve injury, stiffness, blood clots, the need for revision surgery, cardiopulmonary complications, among others, and they were willing to proceed.  Patient tolerated the surgery well. No complications .Patient was taken to PACU where she was stabilized and then transferred to the orthopedic floor.  Patient started on Lovenox 30 mg q 12 hrs. Foot pumps applied bilaterally at 80 mm hgb. Heels elevated off bed with rolled towels. No evidence of DVT. Calves non tender. Negative Homan. Physical therapy started on day #1 for gait training and transfer with OT starting on  day #1 for ADL and assisted devices. Patient has done well with therapy. Ambulated greater than 200 feet upon being discharged. Was able to ascend and descend 4 steps safely and independently  Patient's IV and Foley were discontinued on day #1 with the Hemovac being discontinued on day #2 along with dressing being changed prior to being discharged   She was given perioperative antibiotics:  Anti-infectives    Start     Dose/Rate Route Frequency Ordered Stop   12/23/16 1400  clindamycin (CLEOCIN) IVPB 600 mg     600 mg 100 mL/hr over 30 Minutes Intravenous Every 6 hours 12/23/16 1333 12/24/16 1359  12/23/16 0600  clindamycin (CLEOCIN) IVPB 900 mg     900 mg 100 mL/hr  over 30 Minutes Intravenous On call to O.R. 12/22/16 2204 12/23/16 0826   12/23/16 0554  clindamycin (CLEOCIN) 900 MG/50ML IVPB    Comments:  LEWIS, CINDY: cabinet override      12/23/16 0554 12/23/16 0811    .  She was fitted with AV 1 compression foot pump devices, instructed on heel pumps, early ambulation, and TED stockings bilaterally for DVT prophylaxis.  She benefited maximally from the hospital stay and there were no complications.    Recent vital signs:  Vitals:   12/23/16 2358 12/24/16 0534  BP: (!) 141/92 125/66  Pulse: (!) 57 80  Resp: 19 19  Temp: 97.6 F (36.4 C) 98.1 F (36.7 C)    Recent laboratory studies:  Lab Results  Component Value Date   HGB 11.9 (L) 12/24/2016   HGB 12.6 12/23/2016   HGB 13.7 12/10/2016   Lab Results  Component Value Date   WBC 7.7 12/24/2016   PLT 274 12/24/2016   Lab Results  Component Value Date   INR 0.95 12/10/2016   Lab Results  Component Value Date   NA 134 (L) 12/24/2016   K 3.9 12/24/2016   CL 104 12/24/2016   CO2 22 12/24/2016   BUN 20 12/24/2016   CREATININE 1.18 (H) 12/24/2016   GLUCOSE 136 (H) 12/24/2016    Discharge Medications:   Allergies as of 12/25/2016      Reactions   Cefaclor Hives   Naproxen Palpitations      Medication List    TAKE these medications   acetaminophen 650 MG CR tablet Commonly known as:  TYLENOL Take 1,300 mg by mouth every 8 (eight) hours as needed for pain.   amLODipine 5 MG tablet Commonly known as:  NORVASC Take 5 mg by mouth at bedtime.   cetirizine 10 MG tablet Commonly known as:  ZYRTEC Take 10 mg by mouth daily as needed for allergies.   enoxaparin 30 MG/0.3ML injection Commonly known as:  LOVENOX Inject 0.3 mLs (30 mg total) into the skin every 12 (twelve) hours.   losartan-hydrochlorothiazide 100-25 MG tablet Commonly known as:  HYZAAR Take 1 tablet by mouth at bedtime.   oxyCODONE 5 MG immediate release tablet Commonly known as:  Oxy  IR/ROXICODONE Take 1-2 tablets (5-10 mg total) by mouth every 4 (four) hours as needed for severe pain or breakthrough pain.   potassium chloride SA 20 MEQ tablet Commonly known as:  K-DUR,KLOR-CON Take 20 mEq by mouth at bedtime.   traMADol 50 MG tablet Commonly known as:  ULTRAM Take 1-2 tablets (50-100 mg total) by mouth every 4 (four) hours as needed for moderate pain.            Durable Medical Equipment        Start     Ordered   12/23/16 1334  DME Walker rolling  Once    Question:  Patient needs a walker to treat with the following condition  Answer:  Total knee replacement status   12/23/16 1333   12/23/16 1334  DME Bedside commode  Once    Question:  Patient needs a bedside commode to treat with the following condition  Answer:  Total knee replacement status   12/23/16 1333      Diagnostic Studies: Dg Knee Left Port  Result Date: 12/23/2016 CLINICAL DATA:  As post left total knee joint replacement. EXAM: PORTABLE LEFT KNEE - 1-2  VIEW COMPARISON:  Preoperative study of May 23, 2016 FINDINGS: The patient has undergone total knee joint prosthesis placement. Radiographic positioning of the prosthetic components is good. The interface with the native bone is normal. No native bone abnormality is observed. Surgical drain lines and skin staples are present. IMPRESSION: There is no immediate postprocedure complication following left total knee joint prosthesis placement. Electronically Signed   By: David  [place] M.D.   On: 12/23/2016 12:11    Disposition: 01-Home or Self Care    Follow-up Information    Michael Walrath R., PA On 01/07/2017.   Specialty:  Physician Assistant Why:  at 1:15pm Contact information: [address] [facility] [place] [address] [phone]        Donato Heinz, MD On 02/04/2017.   Specialty:  Orthopedic Surgery Why:  at 10:45am Contact information: [address] [facility] [place]  [address] [phone]            Signed: Tera Partridge 12/24/2016, 7:41 AM

## 2016-12-24 NOTE — Progress Notes (Signed)
   Subjective: 1 Day Post-Op Procedure(s) (LRB): COMPUTER ASSISTED TOTAL KNEE ARTHROPLASTY (Left) Patient reports pain as 2 on 0-10 scale.   Patient is well, and has had no acute complaints or problems We will start therapy today.  Plan is to go Home after hospital stay. no nausea and no vomiting Patient denies any chest pains or shortness of breath. Objective: Vital signs in last 24 hours: Temp:  [96.2 F (35.7 C)-98.1 F (36.7 C)] 98.1 F (36.7 C) (02/13 0534) Pulse Rate:  [57-80] 80 (02/13 0534) Resp:  [12-20] 19 (02/13 0534) BP: (125-165)/(66-99) 125/66 (02/13 0534) SpO2:  [98 %-100 %] 99 % (02/13 0534) FiO2 (%):  [21 %] 21 % (02/12 1334) Weight:  [102.1 kg (225 lb)] 102.1 kg (225 lb) (02/12 1300) Heels are non tender and elevated off the bed using rolled towels along with bone foam under the left heel  Intake/Output from previous day: 02/12 0701 - 02/13 0700 In: 3501.7 [P.O.:240; I.V.:2811.7; IV Piggyback:450] Out: 4825 [Urine:4500; Blood:50] Intake/Output this shift: No intake/output data recorded.   Recent Labs  12/23/16 0628 12/24/16 0537  HGB 12.6 11.9*    Recent Labs  12/23/16 0628 12/24/16 0537  WBC  --  7.7  RBC  --  3.68*  HCT 37.0 34.5*  PLT  --  274    Recent Labs  12/23/16 0628 12/24/16 0537  NA 140 134*  K 3.8 3.9  CL  --  104  CO2  --  22  BUN  --  20  CREATININE  --  1.18*  GLUCOSE 95 136*  CALCIUM  --  9.0   No results for input(s): LABPT, INR in the last 72 hours.  EXAM General - Patient is Alert, Appropriate and Oriented Extremity - Neurologically intact Neurovascular intact Sensation intact distally Intact pulses distally Dorsiflexion/Plantar flexion intact Dressing - dressing C/D/I Motor Function - intact, moving foot and toes well on exam. Able to do straight leg raise on her own  Past Medical History:  Diagnosis Date  . Hypertension     Assessment/Plan: 1 Day Post-Op Procedure(s) (LRB): COMPUTER ASSISTED TOTAL  KNEE ARTHROPLASTY (Left) Active Problems:   S/P total knee arthroplasty  Estimated body mass index is 33.23 kg/m as calculated from the following:   Height as of this encounter: 5\' 9"  (1.753 m).   Weight as of this encounter: 102.1 kg (225 lb). Advance diet Up with therapy D/C IV fluids Plan for discharge tomorrow Discharge home with home health  Labs: Were reviewed DVT Prophylaxis - Lovenox, Foot Pumps and TED hose Weight-Bearing as tolerated to left leg Begin working on bowel movement Labs tomorrow morning D/C O2 and Pulse OX and try on Room [employer]  Jon R. [facility] 12/24/2016, 7:35 AM

## 2016-12-24 NOTE — Anesthesia Postprocedure Evaluation (Signed)
Anesthesia Post Note  Patient: [name]  Procedure(s) Performed: Procedure(s) (LRB): COMPUTER ASSISTED TOTAL KNEE ARTHROPLASTY (Left)  Patient location during evaluation: Nursing Unit Anesthesia Type: Spinal Level of consciousness: awake, awake and alert and oriented Pain management: pain level controlled Vital Signs Assessment: post-procedure vital signs reviewed and stable Respiratory status: spontaneous breathing Cardiovascular status: blood pressure returned to baseline Postop Assessment: no signs of nausea or vomiting and adequate PO intake Anesthetic complications: no     Last Vitals:  Vitals:   12/23/16 2358 12/24/16 0534  BP: (!) 141/92 125/66  Pulse: (!) 57 80  Resp: 19 19  Temp: 36.4 C 36.7 C    Last Pain:  Vitals:   12/24/16 0534  TempSrc: Oral  PainSc:                  Amy Caldwelleana Dhanush Norton

## 2016-12-25 LAB — CBC
HCT: 31.1 % — ABNORMAL LOW (ref 35.0–47.0)
HEMOGLOBIN: 10.6 g/dL — AB (ref 12.0–16.0)
MCH: 31.9 pg (ref 26.0–34.0)
MCHC: 34 g/dL (ref 32.0–36.0)
MCV: 93.6 fL (ref 80.0–100.0)
Platelets: 237 10*3/uL (ref 150–440)
RBC: 3.32 MIL/uL — ABNORMAL LOW (ref 3.80–5.20)
RDW: 13.8 % (ref 11.5–14.5)
WBC: 5.3 10*3/uL (ref 3.6–11.0)

## 2016-12-25 LAB — BASIC METABOLIC PANEL
Anion gap: 5 (ref 5–15)
BUN: 26 mg/dL — AB (ref 6–20)
CHLORIDE: 108 mmol/L (ref 101–111)
CO2: 24 mmol/L (ref 22–32)
CREATININE: 1.35 mg/dL — AB (ref 0.44–1.00)
Calcium: 8.9 mg/dL (ref 8.9–10.3)
GFR calc Af Amer: 50 mL/min — ABNORMAL LOW (ref >60)
GFR calc non Af Amer: 43 mL/min — ABNORMAL LOW (ref >60)
GLUCOSE: 98 mg/dL (ref 65–99)
Potassium: 3.8 mmol/L (ref 3.5–5.1)
SODIUM: 137 mmol/L (ref 135–145)

## 2016-12-25 MED ORDER — ENOXAPARIN SODIUM 30 MG/0.3ML ~~LOC~~ SOLN: 30.0000 mg | Freq: Two times a day (BID) | SUBCUTANEOUS | 0 refills | Status: AC

## 2016-12-25 MED ORDER — TRAMADOL HCL 50 MG PO TABS: 50.0000 mg | ORAL_TABLET | ORAL | 0 refills | Status: AC | PRN

## 2016-12-25 MED ORDER — OXYCODONE HCL 5 MG PO TABS: 5.0000 mg | ORAL_TABLET | ORAL | 0 refills | Status: AC | PRN

## 2016-12-25 NOTE — Progress Notes (Signed)
Patient discharge summary reviewed with verbal understanding. Two narcotics Rx given upon discharge. Lovenox kit provided to reinforce education. Escorted to personal vehicle via wc

## 2016-12-25 NOTE — Progress Notes (Signed)
   Subjective: 2 Days Post-Op Procedure(s) (LRB): COMPUTER ASSISTED TOTAL KNEE ARTHROPLASTY (Left) Patient reports pain as 2 on 0-10 scale.   Patient is well, and has had no acute complaints or problems Continue with physical therapy today.  Plan is to go Home after hospital stay. no nausea and no vomiting Patient denies any chest pains or shortness of breath. Objective: Vital signs in last 24 hours: Temp:  [98.1 F (36.7 C)-98.3 F (36.8 C)] 98.3 F (36.8 C) (02/14 0541) Pulse Rate:  [69-77] 71 (02/14 0541) Resp:  [16-19] 19 (02/14 0541) BP: (111-139)/(71-82) 139/82 (02/14 0541) SpO2:  [98 %-100 %] 100 % (02/14 0541) well approximated incision Heels are non tender and elevated off the bed using rolled towels Intake/Output from previous day: 02/13 0701 - 02/14 0700 In: 1776.7 [P.O.:720; I.V.:1056.7] Out: 240 [Drains:240] Intake/Output this shift: No intake/output data recorded.   Recent Labs  12/23/16 0628 12/24/16 0537 12/25/16 0511  HGB 12.6 11.9* 10.6*    Recent Labs  12/24/16 0537 12/25/16 0511  WBC 7.7 5.3  RBC 3.68* 3.32*  HCT 34.5* 31.1*  PLT 274 237    Recent Labs  12/24/16 0537 12/25/16 0511  NA 134* 137  K 3.9 3.8  CL 104 108  CO2 22 24  BUN 20 26*  CREATININE 1.18* 1.35*  GLUCOSE 136* 98  CALCIUM 9.0 8.9   No results for input(s): LABPT, INR in the last 72 hours.  EXAM General - Patient is Alert, Appropriate and Oriented Extremity - Neurologically intact Neurovascular intact Sensation intact distally Intact pulses distally Dorsiflexion/Plantar flexion intact No cellulitis present Compartment soft Dressing - scant drainage Motor Function - intact, moving foot and toes well on exam.    Past Medical History:  Diagnosis Date  . Hypertension     Assessment/Plan: 2 Days Post-Op Procedure(s) (LRB): COMPUTER ASSISTED TOTAL KNEE ARTHROPLASTY (Left) Active Problems:   S/P total knee arthroplasty  Estimated body mass index is 33.23  kg/m as calculated from the following:   Height as of this encounter: 5\' 9"  (1.753 m).   Weight as of this encounter: 102.1 kg (225 lb). Up with therapy Discharge home with home health after pt has a bowel movement and does therapy this am  Labs: reviewed DVT Prophylaxis - Lovenox, Foot Pumps and TED hose Weight-Bearing as tolerated to left leg Pt needs a bowel movement prior to d/c Please wash and apply TED stocking to operative leg Please change dressing prior to d/c  Jon R. [facility]fe PA [facility] 12/25/2016, 7:16 AM

## 2016-12-25 NOTE — Progress Notes (Signed)
Physical Therapy Treatment Patient Details Name: Amy FurlKaren M Norton MRN: [id number] DOB: 10/21/1961 Today's Date: 12/25/2016    History of Present Illness Pt is a pleasant 56 yo female, s/p L TKR with PMHx significant for HTN. R TKR performed approx 5 years ago with no complications.    PT Comments    Pt stated some discomfort this morning w/ pain 2/10, willing to participate in PT treatment. She demonstrated improved mobility in functional tasks and able to transfer in and OOB w/ modified independence and required increased time to advance L LE due to pain. Pt displays L knee ROM has improved but still limited currently ext/flex is 1/100 AAROM. There was mild "popping" in L knee w/ heel slides but did not effect stability or increase pain. She ambulated 200' using a RW and displayed increased gait speed under PT supervision, required cuing for upright posture to help reduce work on UEs. Pt ascended and descended 12 steps using a R railing and with min guarding for safety. She demonstrated good safety awareness and participation throughout session. Pt safe for household mobility but still displays strength, ROM, and balance deficits as well as increased pain; she will benefit from skilled PT to correct deficits and improve overall function. Recommend HHPT following acute hospital stay.     Follow Up Recommendations  Home health PT     Equipment Recommendations  3in1 (PT)    Recommendations for Other Services       Precautions / Restrictions Precautions Precautions: Fall Restrictions Weight Bearing Restrictions: Yes LLE Weight Bearing: Weight bearing as tolerated    Mobility  Bed Mobility Overal bed mobility: Modified Independent Bed Mobility: Supine to Sit;Sit to Supine     Supine to sit: Modified independent (Device/Increase time) Sit to supine: Modified independent (Device/Increase time)   General bed mobility comments: increased time to advance LE's out of bed, increase in pain  when moving L LE  Transfers Overall transfer level: Modified independent Equipment used: Rolling walker (2 wheeled) Transfers: Sit to/from Stand Sit to Stand: Modified independent (Device/Increase time)         General transfer comment: good hand placement and use of LLE's, good base of support, symmetrical WBing between both LE's  Ambulation/Gait Ambulation/Gait assistance: Supervision Ambulation Distance (Feet): 200 Feet Assistive device: Rolling walker (2 wheeled) Gait Pattern/deviations: Step-through pattern;Antalgic;Trunk flexed Gait velocity: improved from previous treatment   General Gait Details: improved gait speed and step trhough pattern, some fatigue in UE's improved with cuing and correction of forward posture,   Stairs     Stair Management: One rail Right Number of Stairs: 12 General stair comments: able to safely ambulate stairs w/ minimal cuing for hand placement  Wheelchair Mobility    Modified Rankin (Stroke Patients Only)       Balance Overall balance assessment: Needs assistance Sitting-balance support: No upper extremity supported;Feet supported Sitting balance-Leahy Scale: Normal Sitting balance - Comments: able to maintain upright posture in sitting w/ no LOB   Standing balance support: Bilateral upper extremity supported Standing balance-Leahy Scale: Good Standing balance comment: Rw for additional stability, no buckling or LOB                    Cognition Arousal/Alertness: Awake/alert Behavior During Therapy: WFL for tasks assessed/performed Overall Cognitive Status: Within Functional Limits for tasks assessed                      Exercises Total Joint Exercises Goniometric ROM: 1-100 active  assist ROM for L knee Other Exercises Other Exercises: seated therex 1x10; to improve strength for functional tasks; heel slides, LAQs, cuing for technique Other Exercises: supine therex 1x12; to improve strength for functional  taks; SAQs, quad sets, SLR, hip abd, cuing for proper technique able to actively perform all exercises    General Comments        Pertinent Vitals/Pain Pain Score: 2  Pain Location: L knee Pain Descriptors / Indicators: Aching Pain Intervention(s): Monitored during session;Limited activity within patient's tolerance;Premedicated before session    Home Living                      Prior Function            PT Goals (current goals can now be found in the care plan section) Acute Rehab PT Goals Patient Stated Goal: Return Home PT Goal Formulation: With patient Time For Goal Achievement: 01/06/17 Potential to Achieve Goals: Good Progress towards PT goals: Progressing toward goals    Frequency    BID      PT Plan Current plan remains appropriate    Co-evaluation             End of Session Equipment Utilized During Treatment: Gait belt Activity Tolerance: Patient tolerated treatment well Patient left: in bed;with call bell/phone within reach;with SCD's reapplied;Other (comment) (polar care applied)     Time: 1610-9604 PT Time Calculation (min) (ACUTE ONLY): 19 min  Charges:                       G Codes:      [employer]  Student PT 12/25/2016, 10:27 AM

## 2016-12-25 NOTE — Care Management Note (Signed)
Case Management Note  Patient Details  Name: [name] MRN: [id number] Date of Birth: 01/20/1961  Subjective/Objective:   Clarified with Lia HoppingJon Wolf, PA that patient should be on Lovenox 40 mg Q d not the 30 mg dose.                  Action/Plan: [facility] notified of discharge. Cost Of Lovenox $5. Patient updated and is agreeable to POC.   Expected Discharge Date:  12/25/16               Expected Discharge Plan:  Home w Home Health Services  In-House Referral:     Discharge planning Services  CM Consult  Post Acute Care Choice:  Durable Medical Equipment, Home Health Choice offered to:  Patient  DME Arranged:  3-N-1 DME Agency:  [facility]  HH Arranged:  PT [facility]  Status of Service:  Completed, signed off  If discussed at Long Length of Stay Meetings, dates discussed:    Additional Comments:  Marily MemosLisa M Saidah Kempton, RN 12/25/2016, 9:32 AM

## 2017-01-27 ENCOUNTER — Other Ambulatory Visit: Payer: Self-pay | Admitting: Internal Medicine

## 2017-01-27 DIAGNOSIS — Z1231 Encounter for screening mammogram for malignant neoplasm of breast: Secondary | ICD-10-CM

## 2017-02-05 ENCOUNTER — Encounter (INDEPENDENT_AMBULATORY_CARE_PROVIDER_SITE_OTHER): Payer: Self-pay

## 2017-02-05 ENCOUNTER — Ambulatory Visit
Admission: RE | Admit: 2017-02-05 | Discharge: 2017-02-05 | Disposition: A | Discharge status: Home / Self Care | Payer: 59 | Source: Ambulatory Visit | Attending: Internal Medicine | Admitting: Internal Medicine

## 2017-02-05 DIAGNOSIS — Z1231 Encounter for screening mammogram for malignant neoplasm of breast: Secondary | ICD-10-CM | POA: Diagnosis not present

## 2018-03-04 ENCOUNTER — Other Ambulatory Visit: Payer: Self-pay | Admitting: Internal Medicine

## 2018-03-04 DIAGNOSIS — Z1231 Encounter for screening mammogram for malignant neoplasm of breast: Secondary | ICD-10-CM

## 2018-03-19 ENCOUNTER — Ambulatory Visit
Admission: RE | Admit: 2018-03-19 | Discharge: 2018-03-19 | Disposition: A | Discharge status: Home / Self Care | Payer: 59 | Source: Ambulatory Visit | Attending: Internal Medicine | Admitting: Internal Medicine

## 2018-03-19 DIAGNOSIS — Z1231 Encounter for screening mammogram for malignant neoplasm of breast: Secondary | ICD-10-CM | POA: Diagnosis present

## 2019-01-12 DIAGNOSIS — E7800 Pure hypercholesterolemia, unspecified: Secondary | ICD-10-CM | POA: Diagnosis not present

## 2019-01-12 DIAGNOSIS — I10 Essential (primary) hypertension: Secondary | ICD-10-CM | POA: Diagnosis not present

## 2019-01-12 DIAGNOSIS — Z79899 Other long term (current) drug therapy: Secondary | ICD-10-CM | POA: Diagnosis not present

## 2019-06-07 ENCOUNTER — Other Ambulatory Visit: Payer: Self-pay | Admitting: Internal Medicine

## 2019-06-07 DIAGNOSIS — Z1231 Encounter for screening mammogram for malignant neoplasm of breast: Secondary | ICD-10-CM

## 2019-07-09 DIAGNOSIS — I10 Essential (primary) hypertension: Secondary | ICD-10-CM | POA: Diagnosis not present

## 2019-07-09 DIAGNOSIS — R350 Frequency of micturition: Secondary | ICD-10-CM | POA: Diagnosis not present

## 2019-07-09 DIAGNOSIS — R252 Cramp and spasm: Secondary | ICD-10-CM | POA: Diagnosis not present

## 2019-07-09 DIAGNOSIS — E7800 Pure hypercholesterolemia, unspecified: Secondary | ICD-10-CM | POA: Diagnosis not present

## 2019-07-09 DIAGNOSIS — R358 Other polyuria: Secondary | ICD-10-CM | POA: Diagnosis not present

## 2019-07-09 DIAGNOSIS — Z79899 Other long term (current) drug therapy: Secondary | ICD-10-CM | POA: Diagnosis not present

## 2019-07-20 ENCOUNTER — Ambulatory Visit
Admission: RE | Admit: 2019-07-20 | Discharge: 2019-07-20 | Disposition: A | Discharge status: Home / Self Care | Payer: 59 | Source: Ambulatory Visit | Attending: Internal Medicine | Admitting: Internal Medicine

## 2019-07-20 DIAGNOSIS — Z1231 Encounter for screening mammogram for malignant neoplasm of breast: Secondary | ICD-10-CM | POA: Diagnosis not present

## 2019-07-21 ENCOUNTER — Other Ambulatory Visit: Payer: Self-pay | Admitting: Family Medicine

## 2019-07-21 DIAGNOSIS — R921 Mammographic calcification found on diagnostic imaging of breast: Secondary | ICD-10-CM

## 2019-07-21 DIAGNOSIS — R928 Other abnormal and inconclusive findings on diagnostic imaging of breast: Secondary | ICD-10-CM

## 2019-07-26 ENCOUNTER — Other Ambulatory Visit: Payer: Self-pay | Admitting: Internal Medicine

## 2019-07-26 DIAGNOSIS — R928 Other abnormal and inconclusive findings on diagnostic imaging of breast: Secondary | ICD-10-CM

## 2019-07-30 ENCOUNTER — Ambulatory Visit
Admission: RE | Admit: 2019-07-30 | Discharge: 2019-07-30 | Disposition: A | Discharge status: Home / Self Care | Payer: 59 | Source: Ambulatory Visit | Attending: Internal Medicine | Admitting: Internal Medicine

## 2019-07-30 DIAGNOSIS — R928 Other abnormal and inconclusive findings on diagnostic imaging of breast: Secondary | ICD-10-CM | POA: Diagnosis not present

## 2019-07-30 DIAGNOSIS — N6489 Other specified disorders of breast: Secondary | ICD-10-CM | POA: Diagnosis not present

## 2019-08-03 ENCOUNTER — Other Ambulatory Visit: Payer: Self-pay | Admitting: Internal Medicine

## 2019-08-03 DIAGNOSIS — R928 Other abnormal and inconclusive findings on diagnostic imaging of breast: Secondary | ICD-10-CM

## 2019-08-10 ENCOUNTER — Ambulatory Visit: Payer: 59

## 2019-08-11 ENCOUNTER — Ambulatory Visit
Admission: RE | Admit: 2019-08-11 | Discharge: 2019-08-11 | Disposition: A | Discharge status: Home / Self Care | Payer: 59 | Source: Ambulatory Visit | Attending: Internal Medicine | Admitting: Internal Medicine

## 2019-08-11 DIAGNOSIS — R928 Other abnormal and inconclusive findings on diagnostic imaging of breast: Secondary | ICD-10-CM

## 2019-08-11 DIAGNOSIS — N6489 Other specified disorders of breast: Secondary | ICD-10-CM | POA: Diagnosis not present

## 2019-08-11 DIAGNOSIS — N6081 Other benign mammary dysplasias of right breast: Secondary | ICD-10-CM | POA: Diagnosis not present

## 2019-08-12 LAB — SURGICAL PATHOLOGY

## 2020-02-09 ENCOUNTER — Other Ambulatory Visit: Payer: Self-pay | Admitting: Internal Medicine

## 2020-02-22 ENCOUNTER — Other Ambulatory Visit: Payer: Self-pay | Admitting: Internal Medicine

## 2020-04-19 ENCOUNTER — Ambulatory Visit
Admission: EM | Admit: 2020-04-19 | Discharge: 2020-04-19 | Disposition: A | Discharge status: Home / Self Care | Payer: 59 | Attending: Emergency Medicine | Admitting: Emergency Medicine

## 2020-04-19 DIAGNOSIS — R109 Unspecified abdominal pain: Secondary | ICD-10-CM

## 2020-04-19 LAB — POCT URINALYSIS DIP (MANUAL ENTRY)
Bilirubin, UA: NEGATIVE
Glucose, UA: NEGATIVE mg/dL
Ketones, POC UA: NEGATIVE mg/dL
Leukocytes, UA: NEGATIVE
Nitrite, UA: NEGATIVE
Protein Ur, POC: NEGATIVE mg/dL
Spec Grav, UA: 1.025 (ref 1.010–1.025)
Urobilinogen, UA: 1 E.U./dL
pH, UA: 6 (ref 5.0–8.0)

## 2020-04-19 MED ORDER — IBUPROFEN 800 MG PO TABS: 800.0000 mg | ORAL_TABLET | Freq: Three times a day (TID) | ORAL | 0 refills | Status: AC | PRN

## 2020-04-19 NOTE — ED Triage Notes (Signed)
Patient reports right sided flank pain that began early Tuesday morning. Denies dysuria.

## 2020-04-19 NOTE — Discharge Instructions (Signed)
Take the ibuprofen as needed for discomfort.    Please let me or your primary care provider know if you develop a rash or other symptoms.    Follow up with your primary care provider if your symptoms are not improving.

## 2020-04-19 NOTE — ED Provider Notes (Signed)
[name]    CSN: [id number] Arrival date & time: 04/19/20  0802      History   Chief Complaint Chief Complaint  Patient presents with  . Flank Pain    right    HPI [name] is a 59 y.o. female.  Patient presents with "burning" pain in her right side from beneath right breast around to her flank x 1 day.  She describes the pain as "burning" "feels rashy", painful to touch, 9/10, constant, woke her from sleep because hurts to lie on that side.  No falls or injury.  No rash or lesions.  She denies fever, chills, chest pain, SOB, abdominal pain, dysuria, vomiting, diarrhea, constipation, or other symptoms.  Treatment attempted at home with Tylenol without relief.   The history is provided by the patient.    Past Medical History:  Diagnosis Date  . Hypertension     Patient Active Problem List   Diagnosis Date Noted  . S/P total knee arthroplasty 12/23/2016  . Primary osteoarthritis of left knee 11/11/2016  . Status post total right knee replacement 11/11/2016  . Venous stasis 12/18/2015  . HTN, goal below 140/80 12/18/2015    Past Surgical History:  Procedure Laterality Date  . BREAST BIOPSY Right    affirm bx foe distortion, x marker, path pending  . dental implants    . KNEE ARTHROPLASTY Left 12/23/2016   Procedure: COMPUTER ASSISTED TOTAL KNEE ARTHROPLASTY;  Surgeon: Dereck Leep, MD;  Location: [facility] ORS;  Service: Orthopedics;  Laterality: Left;  . REPLACEMENT TOTAL KNEE      OB History   No obstetric history on file.      Home Medications    Prior to Admission medications   Medication Sig Start Date End Date Taking? Authorizing Provider  acetaminophen (TYLENOL) 650 MG CR tablet Take 1,300 mg by mouth every 8 (eight) hours as needed for pain.    [provider]  amLODipine (NORVASC) 5 MG tablet Take 5 mg by mouth at bedtime.  12/18/15 12/17/16  [provider]  cetirizine (ZYRTEC) 10 MG tablet Take 10 mg by mouth daily as  needed for allergies.    [provider]  enoxaparin (LOVENOX) 30 MG/0.3ML injection Inject 0.3 mLs (30 mg total) into the skin every 12 (twelve) hours. 12/25/16   Watt Climes, PA  ibuprofen (ADVIL) 800 MG tablet Take 1 tablet (800 mg total) by mouth every 8 (eight) hours as needed. 04/19/20   Sharion Balloon, NP  losartan-hydrochlorothiazide (HYZAAR) 100-25 MG tablet Take 1 tablet by mouth at bedtime.  12/18/15 12/17/16  [provider]  oxyCODONE (OXY IR/ROXICODONE) 5 MG immediate release tablet Take 1-2 tablets (5-10 mg total) by mouth every 4 (four) hours as needed for severe pain or breakthrough pain. 12/25/16   Watt Climes, PA  potassium chloride SA (K-DUR,KLOR-CON) 20 MEQ tablet Take 20 mEq by mouth at bedtime.  09/23/16   [provider]  traMADol (ULTRAM) 50 MG tablet Take 1-2 tablets (50-100 mg total) by mouth every 4 (four) hours as needed for moderate pain. 12/25/16   Watt Climes, PA    Family History Family History  Problem Relation Age of Onset  . Breast cancer Neg Hx     Social History Social History   Tobacco Use  . Smoking status: Current Every Day Smoker    Packs/day: 0.50  . Smokeless tobacco: Never Used  Substance Use Topics  . Alcohol use: Yes  Comment: occ.  . Drug use: No     Allergies   Cefaclor and Naproxen   Review of Systems Review of Systems  Constitutional: Negative for chills and fever.  HENT: Negative for ear pain and sore throat.   Eyes: Negative for pain and visual disturbance.  Respiratory: Negative for cough and shortness of breath.   Cardiovascular: Negative for chest pain and palpitations.  Gastrointestinal: Negative for abdominal pain, constipation, diarrhea, nausea and vomiting.  Genitourinary: Positive for flank pain. Negative for dysuria and hematuria.  Musculoskeletal: Negative for arthralgias and back pain.  Skin: Negative for color change, rash and wound.  Neurological: Negative for seizures and syncope.    All other systems reviewed and are negative.    Physical Exam Triage Vital Signs ED Triage Vitals  Enc Vitals Group     BP 04/19/20 0807 137/90     Pulse Rate 04/19/20 0807 64     Resp 04/19/20 0807 14     Temp 04/19/20 0807 98 F (36.7 C)     Temp Source 04/19/20 0807 Temporal     SpO2 04/19/20 0807 97 %     Weight --      Height --      Head Circumference --      Peak Flow --      Pain Score 04/19/20 0805 9     Pain Loc --      Pain Edu? --      Excl. in GC? --    No data found.  Updated Vital Signs BP 137/90 (BP Location: Left Arm)   Pulse 64   Temp 98 F (36.7 C) (Temporal)   Resp 14   SpO2 97%   Visual Acuity Right Eye Distance:   Left Eye Distance:   Bilateral Distance:    Right Eye Near:   Left Eye Near:    Bilateral Near:     Physical Exam Vitals and nursing note reviewed.  Constitutional:      General: She is not in acute distress.    Appearance: She is well-developed. She is not ill-appearing.  HENT:     Head: Normocephalic and atraumatic.     Mouth/Throat:     Mouth: Mucous membranes are moist.     Pharynx: Oropharynx is clear.  Eyes:     Conjunctiva/sclera: Conjunctivae normal.  Cardiovascular:     Rate and Rhythm: Normal rate and regular rhythm.     Heart sounds: No murmur.  Pulmonary:     Effort: Pulmonary effort is normal. No respiratory distress.     Breath sounds: Normal breath sounds.  Abdominal:     General: Bowel sounds are normal. There is no distension.     Palpations: Abdomen is soft.     Tenderness: There is no abdominal tenderness. There is no right CVA tenderness, left CVA tenderness, guarding or rebound.  Musculoskeletal:        General: Normal range of motion.     Cervical back: Neck supple.     Right lower leg: No edema.     Left lower leg: No edema.  Skin:    General: Skin is warm and dry.     Findings: No bruising, erythema, lesion or rash.     Comments: Skin mildly sensitive to touch from beneath right breast  around to right flank.  No rash or lesions.   Neurological:     General: No focal deficit present.     Mental Status: She is alert and oriented  to person, place, and time.     Sensory: No sensory deficit.     Motor: No weakness.     Gait: Gait normal.  Psychiatric:        Mood and Affect: Mood normal.        Behavior: Behavior normal.      UC Treatments / Results  Labs (all labs ordered are listed, but only abnormal results are displayed) Labs Reviewed  POCT URINALYSIS DIP (MANUAL ENTRY) - Abnormal; Notable for the following components:      Result Value   Blood, UA trace-intact (*)    All other components within normal limits    EKG   Radiology No results found.  Procedures Procedures (including critical care time)  Medications Ordered in UC Medications - No data to display  Initial Impression / Assessment and Plan / UC Course  I have reviewed the triage vital signs and the nursing notes.  Pertinent labs & imaging results that were available during my care of the patient were reviewed by me and considered in my medical decision making (see chart for details).   Right flank pain.  This could be onset of shingles.  Instructed patient to monitor the area for rash/lesions.  Instructed her to f/u here or with her PCP if she develops a rash or other symptoms. Discussed that she should f/u with her PCP if her symptoms are not improving.  Patient agrees to plan of care.      Final Clinical Impressions(s) / UC Diagnoses   Final diagnoses:  Right flank pain     Discharge Instructions     Take the ibuprofen as needed for discomfort.    Please let me or your primary care provider know if you develop a rash or other symptoms.    Follow up with your primary care provider if your symptoms are not improving.       ED Prescriptions    Medication Sig Dispense Auth. Provider   ibuprofen (ADVIL) 800 MG tablet Take 1 tablet (800 mg total) by mouth every 8 (eight) hours as  needed. 21 tablet Mickie Bail, NP     I have reviewed the PDMP during this encounter.   Mickie Bail, NP 04/19/20 [phone]

## 2020-04-24 ENCOUNTER — Telehealth: Payer: Self-pay | Admitting: Emergency Medicine

## 2020-04-24 MED ORDER — VALACYCLOVIR HCL 1 G PO TABS: 1000.0000 mg | ORAL_TABLET | Freq: Three times a day (TID) | ORAL | 0 refills | Status: AC

## 2020-04-24 NOTE — Telephone Encounter (Signed)
Patient called [name].  States she now has a vesicluar, papular rash on her right flank.  Prescription for Valtrex 1000 mg TID x 7 days sent to her pharmacy.  Discussed Shingles precautions.  Instructed patient to be seen by her PCP or here if any concerns.

## 2020-05-24 ENCOUNTER — Other Ambulatory Visit: Payer: Self-pay | Admitting: Internal Medicine

## 2020-05-24 DIAGNOSIS — Z1231 Encounter for screening mammogram for malignant neoplasm of breast: Secondary | ICD-10-CM

## 2020-05-24 DIAGNOSIS — I878 Other specified disorders of veins: Secondary | ICD-10-CM | POA: Diagnosis not present

## 2020-05-24 DIAGNOSIS — E7800 Pure hypercholesterolemia, unspecified: Secondary | ICD-10-CM | POA: Diagnosis not present

## 2020-05-24 DIAGNOSIS — Z0000 Encounter for general adult medical examination without abnormal findings: Secondary | ICD-10-CM | POA: Diagnosis not present

## 2020-05-24 DIAGNOSIS — I10 Essential (primary) hypertension: Secondary | ICD-10-CM | POA: Diagnosis not present

## 2020-05-24 DIAGNOSIS — Z79899 Other long term (current) drug therapy: Secondary | ICD-10-CM | POA: Diagnosis not present

## 2020-06-05 DIAGNOSIS — Z1211 Encounter for screening for malignant neoplasm of colon: Secondary | ICD-10-CM | POA: Diagnosis not present

## 2020-09-18 ENCOUNTER — Other Ambulatory Visit: Payer: Self-pay | Admitting: Internal Medicine

## 2020-10-02 DIAGNOSIS — Z23 Encounter for immunization: Secondary | ICD-10-CM | POA: Diagnosis not present

## 2020-12-05 ENCOUNTER — Other Ambulatory Visit: Payer: Self-pay

## 2020-12-20 ENCOUNTER — Other Ambulatory Visit: Payer: Self-pay

## 2021-01-18 ENCOUNTER — Other Ambulatory Visit: Payer: Self-pay | Admitting: Internal Medicine

## 2021-02-12 ENCOUNTER — Other Ambulatory Visit: Payer: Self-pay

## 2021-02-12 MED ORDER — TELMISARTAN-HCTZ 80-25 MG PO TABS
1.0000 | ORAL_TABLET | Freq: Every day | ORAL | 1 refills | Status: DC
  Filled 2021-02-12: qty 90, 90d supply, fill #0
  Filled 2021-05-07: qty 90, 90d supply, fill #1

## 2021-02-19 ENCOUNTER — Other Ambulatory Visit: Payer: Self-pay

## 2021-03-04 ENCOUNTER — Other Ambulatory Visit: Payer: Self-pay | Admitting: Internal Medicine

## 2021-03-04 ENCOUNTER — Other Ambulatory Visit: Payer: Self-pay

## 2021-03-04 MED ORDER — CLONIDINE HCL 0.2 MG PO TABS
ORAL_TABLET | Freq: Two times a day (BID) | ORAL | 11 refills | Status: DC
  Filled 2021-03-04: qty 60, 30d supply, fill #0
  Filled 2021-04-09: qty 60, 30d supply, fill #1
  Filled 2021-05-06: qty 60, 30d supply, fill #2
  Filled 2021-06-04: qty 60, 30d supply, fill #3
  Filled 2021-07-09: qty 60, 30d supply, fill #4
  Filled 2021-08-06 (×2): qty 60, 30d supply, fill #5
  Filled 2021-09-09: qty 60, 30d supply, fill #6
  Filled 2021-10-09: qty 60, 30d supply, fill #7
  Filled 2021-11-12: qty 60, 30d supply, fill #8
  Filled 2021-12-10: qty 60, 30d supply, fill #9
  Filled 2022-01-12: qty 60, 30d supply, fill #10
  Filled 2022-02-14: qty 60, 30d supply, fill #11

## 2021-03-05 ENCOUNTER — Other Ambulatory Visit: Payer: Self-pay | Admitting: Internal Medicine

## 2021-03-05 ENCOUNTER — Other Ambulatory Visit: Payer: Self-pay

## 2021-03-05 MED ORDER — POTASSIUM CHLORIDE CRYS ER 20 MEQ PO TBCR
EXTENDED_RELEASE_TABLET | Freq: Two times a day (BID) | ORAL | 3 refills | Status: AC
  Filled 2021-03-05: qty 180, 90d supply, fill #0
  Filled 2021-06-04: qty 180, 90d supply, fill #1
  Filled 2021-09-04: qty 180, 90d supply, fill #2
  Filled 2021-12-10: qty 180, 90d supply, fill #3

## 2021-03-06 ENCOUNTER — Other Ambulatory Visit: Payer: Self-pay

## 2021-03-07 ENCOUNTER — Other Ambulatory Visit: Payer: Self-pay

## 2021-03-07 MED ORDER — AMOXICILLIN 500 MG PO CAPS
500.0000 mg | ORAL_CAPSULE | ORAL | 1 refills | Status: AC
  Filled 2021-03-07: qty 12, 3d supply, fill #0

## 2021-03-09 ENCOUNTER — Other Ambulatory Visit: Payer: Self-pay | Admitting: Internal Medicine

## 2021-03-09 ENCOUNTER — Other Ambulatory Visit: Payer: Self-pay

## 2021-03-09 DIAGNOSIS — Z720 Tobacco use: Secondary | ICD-10-CM | POA: Diagnosis not present

## 2021-03-09 DIAGNOSIS — I10 Essential (primary) hypertension: Secondary | ICD-10-CM | POA: Diagnosis not present

## 2021-03-09 DIAGNOSIS — E7800 Pure hypercholesterolemia, unspecified: Secondary | ICD-10-CM | POA: Diagnosis not present

## 2021-03-09 DIAGNOSIS — Z1231 Encounter for screening mammogram for malignant neoplasm of breast: Secondary | ICD-10-CM

## 2021-03-09 DIAGNOSIS — Z79899 Other long term (current) drug therapy: Secondary | ICD-10-CM | POA: Diagnosis not present

## 2021-03-09 DIAGNOSIS — I878 Other specified disorders of veins: Secondary | ICD-10-CM | POA: Diagnosis not present

## 2021-03-09 MED ORDER — AMLODIPINE BESYLATE 5 MG PO TABS
ORAL_TABLET | ORAL | 1 refills | Status: DC
  Filled 2021-03-09 – 2021-03-28 (×2): qty 90, 90d supply, fill #0
  Filled 2021-06-25: qty 90, 90d supply, fill #1

## 2021-03-09 MED ORDER — POTASSIUM CHLORIDE CRYS ER 20 MEQ PO TBCR
20.0000 meq | EXTENDED_RELEASE_TABLET | Freq: Two times a day (BID) | ORAL | 3 refills | Status: AC
  Filled 2021-03-09: qty 180, 90d supply, fill #0

## 2021-03-12 IMAGING — MG MM DIGITAL SCREENING BILAT W/ TOMO W/ CAD
6 of 10 series · 6 of 30 positions shown · non-contrast
Comparison: Previous exam(s).

CLINICAL DATA: Screening.

EXAM:
DIGITAL SCREENING BILATERAL MAMMOGRAM WITH TOMO AND CAD

[L CV synth-2D]
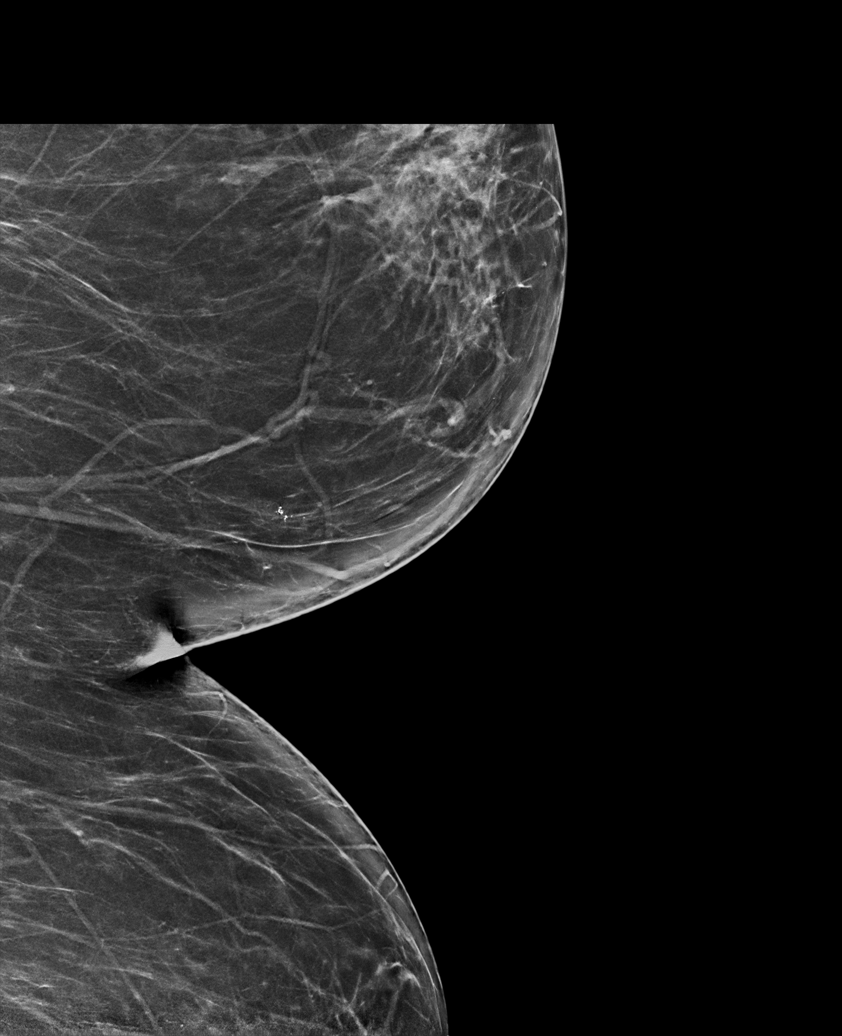

[R CC synth-2D]
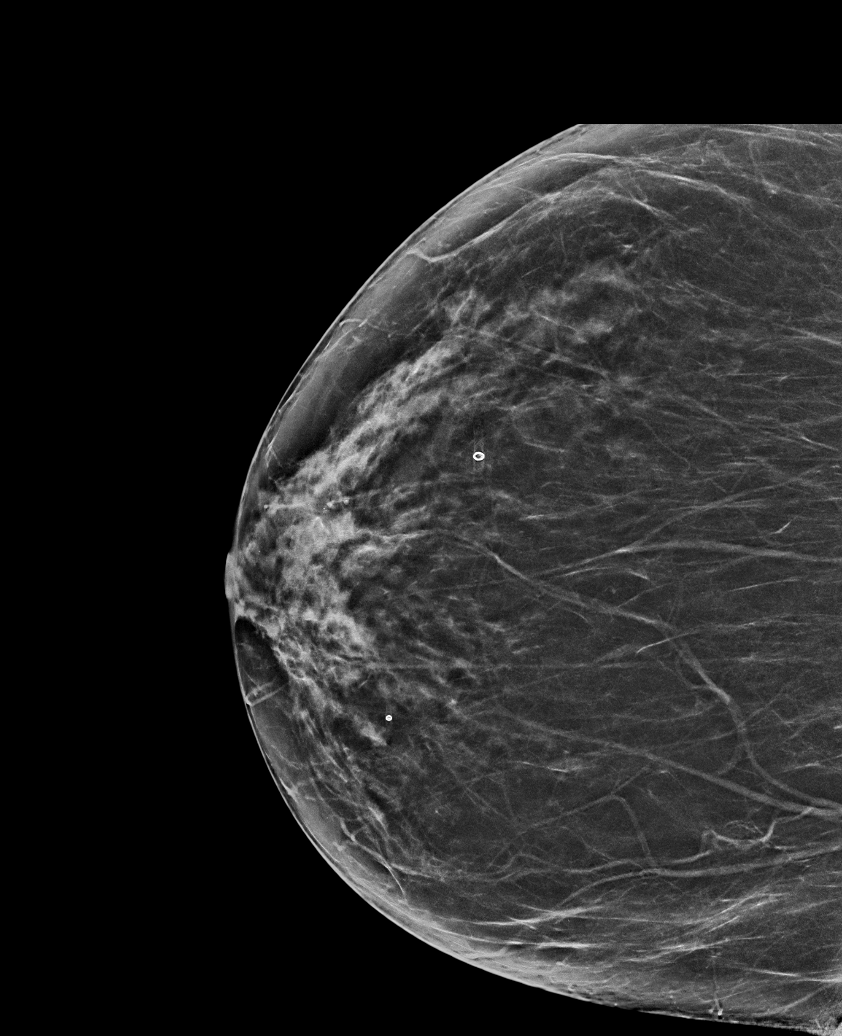

[R MLO synth-2D]
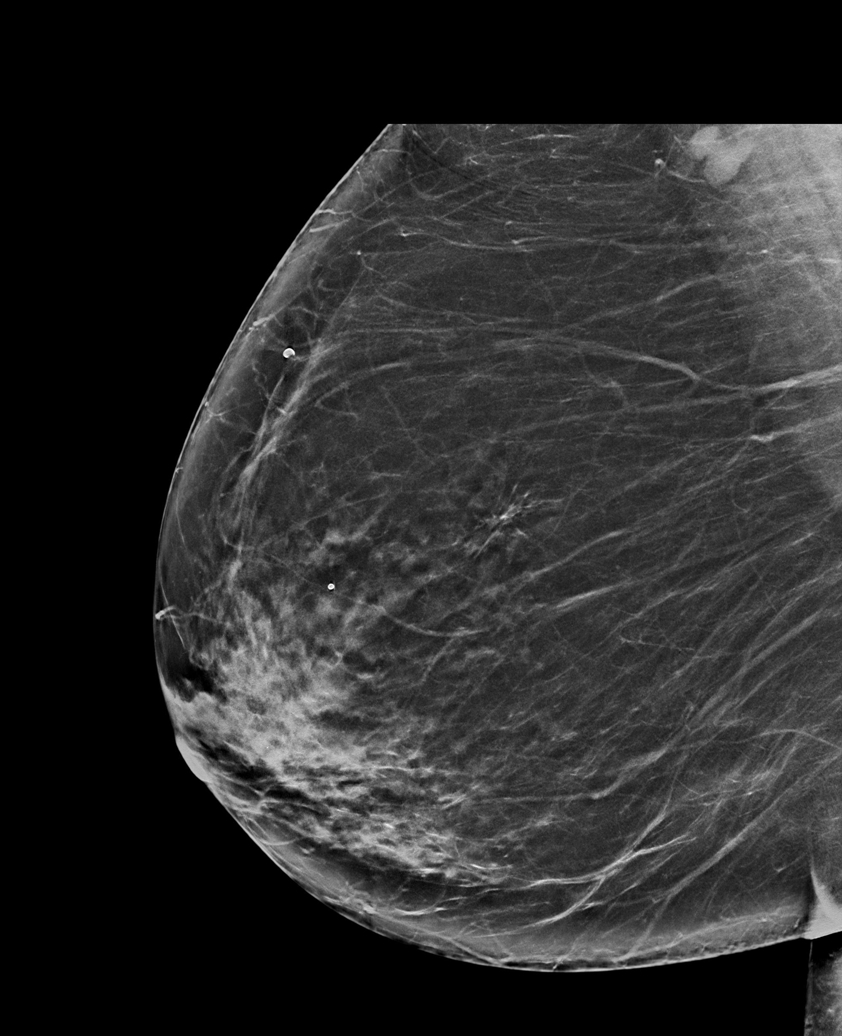

[L CC synth-2D]
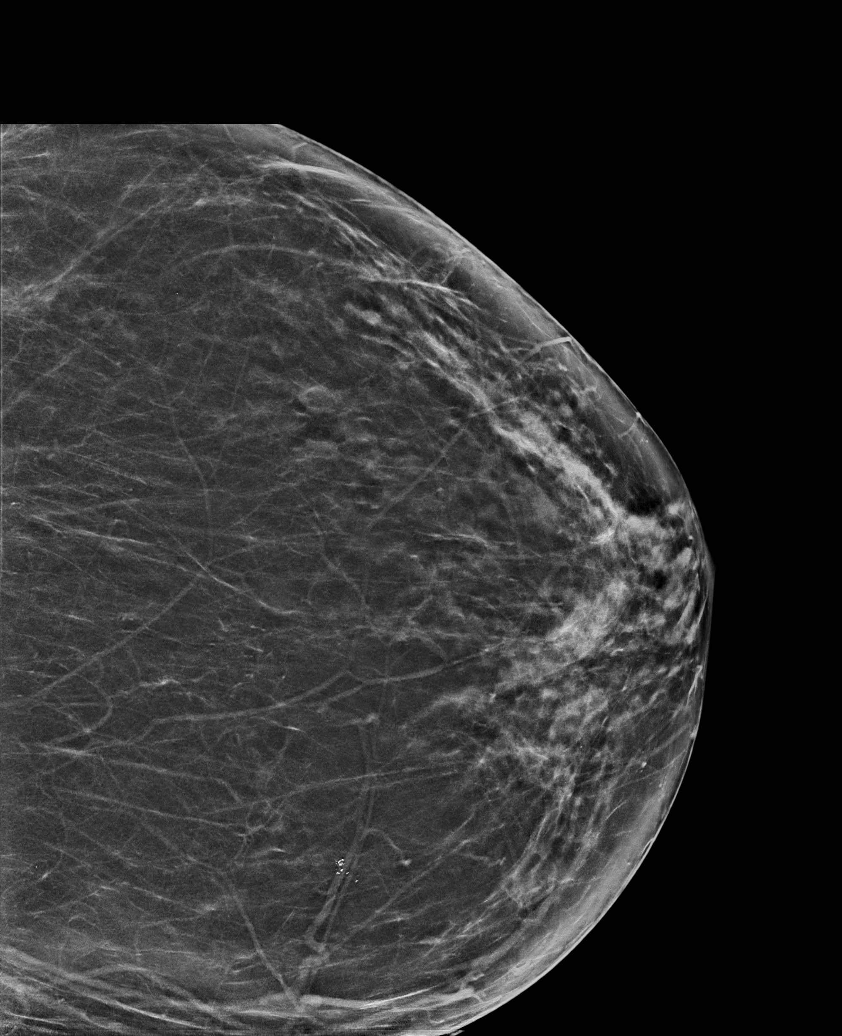

[L MLO synth-2D]
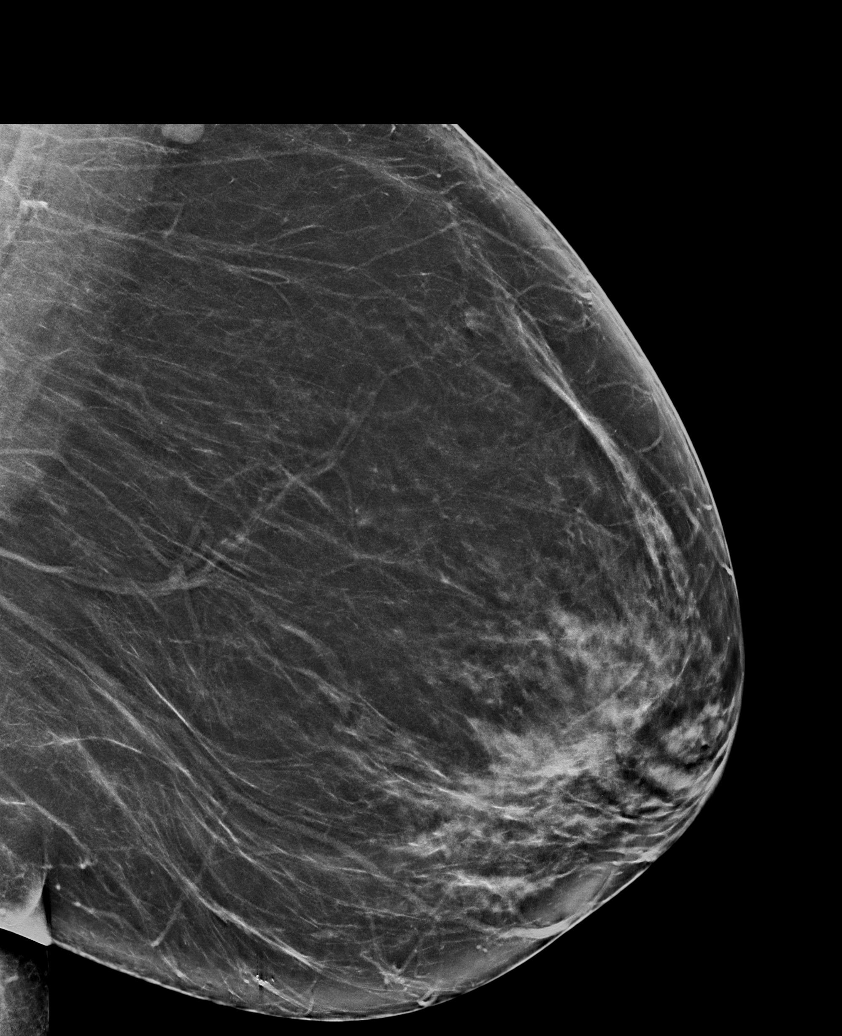

[L CV tomo · tomo slice 39/78.0]
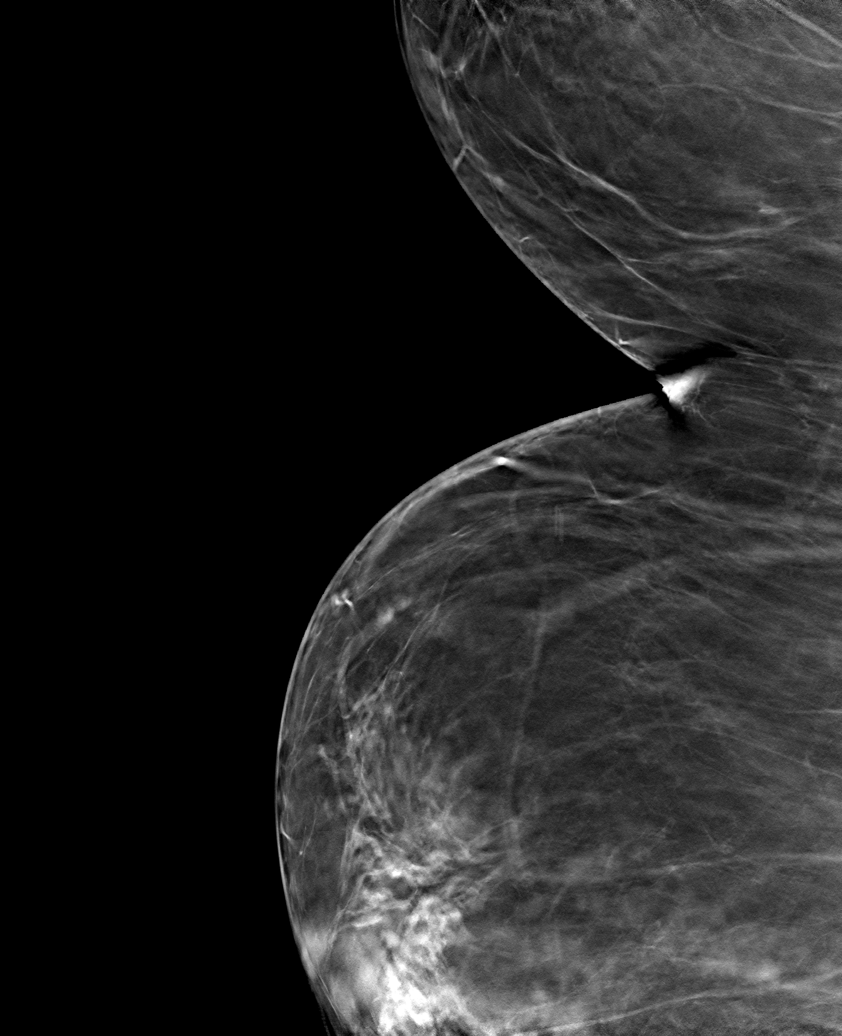

[6 of 30 positions shown; findings below may reference images not displayed]

ACR Breast Density Category c: The breast tissue is heterogeneously
dense, which may obscure small masses.
FINDINGS: In the right breast, possible distortion warrants further
evaluation. In the left breast, no findings suspicious for
malignancy. Images were processed with CAD.
IMPRESSION: Further evaluation is suggested for possible distortion in the right
breast.

RECOMMENDATION:
Diagnostic mammogram and possibly ultrasound of the right breast.
(Code:[id number])

The patient will be contacted regarding the findings, and additional
imaging will be scheduled.

BI-RADS CATEGORY  0: Incomplete. Need additional imaging evaluation
and/or prior mammograms for comparison.

## 2021-03-19 ENCOUNTER — Other Ambulatory Visit: Payer: Self-pay

## 2021-03-19 ENCOUNTER — Other Ambulatory Visit: Payer: Self-pay | Admitting: Internal Medicine

## 2021-03-19 MED ORDER — AMLODIPINE BESYLATE 5 MG PO TABS
ORAL_TABLET | Freq: Every day | ORAL | 1 refills | Status: AC
  Filled 2021-03-19: qty 90, 90d supply, fill #0

## 2021-03-20 ENCOUNTER — Other Ambulatory Visit: Payer: Self-pay

## 2021-03-28 ENCOUNTER — Other Ambulatory Visit: Payer: Self-pay

## 2021-04-04 DIAGNOSIS — M19011 Primary osteoarthritis, right shoulder: Secondary | ICD-10-CM | POA: Diagnosis not present

## 2021-04-04 DIAGNOSIS — M25511 Pain in right shoulder: Secondary | ICD-10-CM | POA: Diagnosis not present

## 2021-04-10 ENCOUNTER — Other Ambulatory Visit: Payer: Self-pay

## 2021-04-22 MED FILL — Rosuvastatin Calcium Tab 10 MG: ORAL | 90 days supply | Qty: 90 | Fill #0 | Status: AC

## 2021-04-23 ENCOUNTER — Other Ambulatory Visit: Payer: Self-pay

## 2021-05-07 ENCOUNTER — Other Ambulatory Visit: Payer: Self-pay

## 2021-05-21 ENCOUNTER — Other Ambulatory Visit: Payer: Self-pay | Admitting: Internal Medicine

## 2021-05-21 DIAGNOSIS — Z1231 Encounter for screening mammogram for malignant neoplasm of breast: Secondary | ICD-10-CM

## 2021-06-04 ENCOUNTER — Other Ambulatory Visit: Payer: Self-pay

## 2021-06-07 ENCOUNTER — Other Ambulatory Visit: Payer: Self-pay

## 2021-06-25 ENCOUNTER — Other Ambulatory Visit: Payer: Self-pay

## 2021-07-09 ENCOUNTER — Other Ambulatory Visit: Payer: Self-pay

## 2021-07-09 MED FILL — Rosuvastatin Calcium Tab 10 MG: ORAL | 90 days supply | Qty: 90 | Fill #1 | Status: AC

## 2021-08-06 ENCOUNTER — Other Ambulatory Visit: Payer: Self-pay

## 2021-08-13 ENCOUNTER — Other Ambulatory Visit: Payer: Self-pay

## 2021-08-13 MED ORDER — AMOXICILLIN 500 MG PO CAPS
2000.0000 mg | ORAL_CAPSULE | ORAL | 3 refills | Status: AC
  Filled 2021-08-13: qty 12, 3d supply, fill #0
  Filled 2021-12-23: qty 12, 3d supply, fill #1
  Filled 2022-06-06: qty 12, 3d supply, fill #2

## 2021-08-16 ENCOUNTER — Other Ambulatory Visit: Payer: Self-pay

## 2021-08-20 ENCOUNTER — Other Ambulatory Visit: Payer: Self-pay

## 2021-08-20 MED ORDER — TELMISARTAN-HCTZ 80-25 MG PO TABS
1.0000 | ORAL_TABLET | Freq: Every day | ORAL | 1 refills | Status: DC
Start: 2021-08-20 — End: 2022-02-17
  Filled 2021-08-20: qty 90, 90d supply, fill #0
  Filled 2021-11-12: qty 90, 90d supply, fill #1

## 2021-08-20 MED ORDER — CLONIDINE HCL 0.2 MG PO TABS
ORAL_TABLET | ORAL | 3 refills | Status: AC
  Filled 2022-06-06: qty 180, fill #0

## 2021-08-20 MED ORDER — AMLODIPINE BESYLATE 5 MG PO TABS
ORAL_TABLET | ORAL | 1 refills | Status: AC
  Filled 2021-08-20: qty 90, 90d supply, fill #0

## 2021-08-21 ENCOUNTER — Other Ambulatory Visit: Payer: Self-pay

## 2021-09-03 ENCOUNTER — Other Ambulatory Visit: Payer: Self-pay

## 2021-09-03 DIAGNOSIS — I10 Essential (primary) hypertension: Secondary | ICD-10-CM | POA: Diagnosis not present

## 2021-09-03 DIAGNOSIS — E7800 Pure hypercholesterolemia, unspecified: Secondary | ICD-10-CM | POA: Diagnosis not present

## 2021-09-03 DIAGNOSIS — Z720 Tobacco use: Secondary | ICD-10-CM | POA: Diagnosis not present

## 2021-09-03 DIAGNOSIS — Z79899 Other long term (current) drug therapy: Secondary | ICD-10-CM | POA: Diagnosis not present

## 2021-09-03 MED ORDER — PHENTERMINE HCL 37.5 MG PO TABS
ORAL_TABLET | ORAL | 0 refills | Status: AC
  Filled 2021-09-03: qty 90, 90d supply, fill #0

## 2021-09-04 ENCOUNTER — Other Ambulatory Visit: Payer: Self-pay

## 2021-09-05 ENCOUNTER — Other Ambulatory Visit: Payer: Self-pay

## 2021-09-10 ENCOUNTER — Other Ambulatory Visit: Payer: Self-pay

## 2021-09-16 ENCOUNTER — Other Ambulatory Visit: Payer: Self-pay

## 2021-09-16 MED ORDER — AMLODIPINE BESYLATE 5 MG PO TABS
ORAL_TABLET | ORAL | 1 refills | Status: DC
  Filled 2021-09-16: qty 90, 90d supply, fill #0
  Filled 2021-12-23: qty 90, 90d supply, fill #1

## 2021-09-17 ENCOUNTER — Other Ambulatory Visit: Payer: Self-pay

## 2021-09-17 ENCOUNTER — Ambulatory Visit
Admission: RE | Admit: 2021-09-17 | Discharge: 2021-09-17 | Disposition: A | Discharge status: Home / Self Care | Payer: 59 | Source: Ambulatory Visit | Attending: Internal Medicine | Admitting: Internal Medicine

## 2021-09-17 DIAGNOSIS — Z1231 Encounter for screening mammogram for malignant neoplasm of breast: Secondary | ICD-10-CM | POA: Insufficient documentation

## 2021-09-18 DIAGNOSIS — Z1211 Encounter for screening for malignant neoplasm of colon: Secondary | ICD-10-CM | POA: Diagnosis not present

## 2021-10-09 ENCOUNTER — Other Ambulatory Visit: Payer: Self-pay

## 2021-10-22 ENCOUNTER — Other Ambulatory Visit: Payer: Self-pay

## 2021-10-22 MED FILL — Rosuvastatin Calcium Tab 10 MG: ORAL | 90 days supply | Qty: 90 | Fill #2 | Status: AC

## 2021-11-12 ENCOUNTER — Other Ambulatory Visit: Payer: Self-pay

## 2021-12-10 ENCOUNTER — Other Ambulatory Visit: Payer: Self-pay

## 2021-12-10 MED ORDER — PHENTERMINE HCL 37.5 MG PO TABS
ORAL_TABLET | ORAL | 0 refills | Status: DC
  Filled 2021-12-10: qty 90, 90d supply, fill #0

## 2021-12-11 ENCOUNTER — Other Ambulatory Visit: Payer: Self-pay

## 2021-12-11 DIAGNOSIS — I10 Essential (primary) hypertension: Secondary | ICD-10-CM | POA: Diagnosis not present

## 2021-12-11 DIAGNOSIS — E7800 Pure hypercholesterolemia, unspecified: Secondary | ICD-10-CM | POA: Diagnosis not present

## 2021-12-11 DIAGNOSIS — Z720 Tobacco use: Secondary | ICD-10-CM | POA: Diagnosis not present

## 2021-12-12 DIAGNOSIS — I10 Essential (primary) hypertension: Secondary | ICD-10-CM | POA: Diagnosis not present

## 2021-12-24 ENCOUNTER — Other Ambulatory Visit: Payer: Self-pay

## 2022-01-14 ENCOUNTER — Other Ambulatory Visit: Payer: Self-pay

## 2022-01-14 DIAGNOSIS — M19011 Primary osteoarthritis, right shoulder: Secondary | ICD-10-CM | POA: Diagnosis not present

## 2022-01-14 DIAGNOSIS — M7581 Other shoulder lesions, right shoulder: Secondary | ICD-10-CM | POA: Diagnosis not present

## 2022-01-14 MED ORDER — CELECOXIB 200 MG PO CAPS
ORAL_CAPSULE | ORAL | 0 refills | Status: AC
  Filled 2022-01-14: qty 60, 30d supply, fill #0

## 2022-01-28 ENCOUNTER — Other Ambulatory Visit: Payer: Self-pay

## 2022-01-28 MED ORDER — ROSUVASTATIN CALCIUM 10 MG PO TABS
ORAL_TABLET | Freq: Every day | ORAL | 3 refills | Status: DC
  Filled 2022-01-28: qty 90, 90d supply, fill #0
  Filled 2022-04-29: qty 90, 90d supply, fill #1
  Filled 2022-08-02: qty 90, 90d supply, fill #2
  Filled 2022-11-05: qty 90, 90d supply, fill #3

## 2022-02-14 ENCOUNTER — Other Ambulatory Visit: Payer: Self-pay

## 2022-02-17 MED ORDER — TELMISARTAN-HCTZ 80-25 MG PO TABS
1.0000 | ORAL_TABLET | Freq: Every day | ORAL | 1 refills | Status: DC
  Filled 2022-02-17: qty 90, 90d supply, fill #0
  Filled 2022-06-06: qty 90, 90d supply, fill #1

## 2022-02-18 ENCOUNTER — Other Ambulatory Visit: Payer: Self-pay

## 2022-03-11 ENCOUNTER — Other Ambulatory Visit: Payer: Self-pay

## 2022-03-11 DIAGNOSIS — Z79899 Other long term (current) drug therapy: Secondary | ICD-10-CM | POA: Diagnosis not present

## 2022-03-11 DIAGNOSIS — E7800 Pure hypercholesterolemia, unspecified: Secondary | ICD-10-CM | POA: Diagnosis not present

## 2022-03-11 DIAGNOSIS — I10 Essential (primary) hypertension: Secondary | ICD-10-CM | POA: Diagnosis not present

## 2022-03-11 MED ORDER — PHENTERMINE HCL 37.5 MG PO TABS
ORAL_TABLET | ORAL | 0 refills | Status: DC
  Filled 2022-03-11: qty 90, 90d supply, fill #0

## 2022-03-11 MED ORDER — POTASSIUM CHLORIDE CRYS ER 20 MEQ PO TBCR
20.0000 meq | EXTENDED_RELEASE_TABLET | Freq: Two times a day (BID) | ORAL | 3 refills | Status: AC
  Filled 2022-03-11: qty 180, 90d supply, fill #0

## 2022-03-12 ENCOUNTER — Other Ambulatory Visit: Payer: Self-pay

## 2022-03-17 ENCOUNTER — Other Ambulatory Visit: Payer: Self-pay

## 2022-03-18 ENCOUNTER — Other Ambulatory Visit: Payer: Self-pay

## 2022-03-18 DIAGNOSIS — I10 Essential (primary) hypertension: Secondary | ICD-10-CM | POA: Diagnosis not present

## 2022-03-18 DIAGNOSIS — E7800 Pure hypercholesterolemia, unspecified: Secondary | ICD-10-CM | POA: Diagnosis not present

## 2022-03-18 DIAGNOSIS — Z6839 Body mass index (BMI) 39.0-39.9, adult: Secondary | ICD-10-CM | POA: Diagnosis not present

## 2022-03-18 DIAGNOSIS — Z720 Tobacco use: Secondary | ICD-10-CM | POA: Diagnosis not present

## 2022-03-18 MED ORDER — TELMISARTAN 80 MG PO TABS
ORAL_TABLET | ORAL | 3 refills | Status: DC
  Filled 2022-03-18: qty 90, 90d supply, fill #0
  Filled 2022-06-11: qty 30, 30d supply, fill #1
  Filled 2022-06-11: qty 60, 60d supply, fill #1
  Filled 2022-09-09: qty 90, 90d supply, fill #2
  Filled 2022-12-16: qty 90, 90d supply, fill #3

## 2022-03-18 MED ORDER — CLONIDINE HCL 0.2 MG PO TABS
ORAL_TABLET | Freq: Two times a day (BID) | ORAL | 11 refills | Status: AC
  Filled 2022-03-18: qty 60, 30d supply, fill #0
  Filled 2022-04-20: qty 60, 30d supply, fill #1
  Filled 2022-05-18: qty 60, 30d supply, fill #2
  Filled 2022-06-21: qty 60, 30d supply, fill #3
  Filled 2022-08-02: qty 60, 30d supply, fill #4
  Filled 2022-09-04: qty 60, 30d supply, fill #5
  Filled 2022-10-08: qty 60, 30d supply, fill #6
  Filled 2022-11-07: qty 60, 30d supply, fill #7
  Filled 2022-12-16: qty 60, 30d supply, fill #8
  Filled 2023-01-27: qty 60, 30d supply, fill #9
  Filled 2023-03-10: qty 60, 30d supply, fill #10

## 2022-03-18 MED ORDER — BISOPROLOL FUMARATE 5 MG PO TABS
ORAL_TABLET | ORAL | 3 refills | Status: DC
  Filled 2022-03-18: qty 90, 90d supply, fill #0
  Filled 2022-06-11: qty 90, 90d supply, fill #1
  Filled 2022-09-15: qty 90, 90d supply, fill #2
  Filled 2022-12-16: qty 90, 90d supply, fill #3

## 2022-03-19 ENCOUNTER — Other Ambulatory Visit: Payer: Self-pay

## 2022-03-25 ENCOUNTER — Other Ambulatory Visit: Payer: Self-pay

## 2022-03-25 MED ORDER — AMLODIPINE BESYLATE 5 MG PO TABS
ORAL_TABLET | ORAL | 1 refills | Status: DC
  Filled 2022-03-25: qty 90, 90d supply, fill #0
  Filled 2022-06-21: qty 90, 90d supply, fill #1

## 2022-03-26 ENCOUNTER — Other Ambulatory Visit: Payer: Self-pay

## 2022-04-15 DIAGNOSIS — I10 Essential (primary) hypertension: Secondary | ICD-10-CM | POA: Diagnosis not present

## 2022-04-20 ENCOUNTER — Other Ambulatory Visit: Payer: Self-pay

## 2022-04-22 ENCOUNTER — Other Ambulatory Visit: Payer: Self-pay

## 2022-04-29 ENCOUNTER — Other Ambulatory Visit: Payer: Self-pay

## 2022-05-01 ENCOUNTER — Other Ambulatory Visit: Payer: Self-pay

## 2022-05-18 ENCOUNTER — Other Ambulatory Visit: Payer: Self-pay

## 2022-05-19 ENCOUNTER — Other Ambulatory Visit: Payer: Self-pay

## 2022-05-19 MED ORDER — PHENTERMINE HCL 37.5 MG PO TABS
ORAL_TABLET | ORAL | 0 refills | Status: DC
  Filled 2022-06-06 (×3): qty 90, fill #0
  Filled 2022-06-06 – 2022-06-10 (×2): qty 90, 90d supply, fill #0

## 2022-05-20 ENCOUNTER — Other Ambulatory Visit: Payer: Self-pay

## 2022-06-06 ENCOUNTER — Other Ambulatory Visit: Payer: Self-pay

## 2022-06-10 ENCOUNTER — Other Ambulatory Visit: Payer: Self-pay

## 2022-06-11 ENCOUNTER — Other Ambulatory Visit: Payer: Self-pay

## 2022-06-21 ENCOUNTER — Other Ambulatory Visit: Payer: Self-pay

## 2022-06-24 ENCOUNTER — Other Ambulatory Visit: Payer: Self-pay

## 2022-06-24 DIAGNOSIS — Z6839 Body mass index (BMI) 39.0-39.9, adult: Secondary | ICD-10-CM | POA: Diagnosis not present

## 2022-06-24 DIAGNOSIS — I10 Essential (primary) hypertension: Secondary | ICD-10-CM | POA: Diagnosis not present

## 2022-06-24 DIAGNOSIS — E7800 Pure hypercholesterolemia, unspecified: Secondary | ICD-10-CM | POA: Diagnosis not present

## 2022-06-24 DIAGNOSIS — Z79899 Other long term (current) drug therapy: Secondary | ICD-10-CM | POA: Diagnosis not present

## 2022-06-24 DIAGNOSIS — Z720 Tobacco use: Secondary | ICD-10-CM | POA: Diagnosis not present

## 2022-06-24 MED ORDER — PSEUDOEPHEDRINE-GUAIFENESIN ER 60-600 MG PO TB12: ORAL_TABLET | ORAL | 0 refills | Status: AC

## 2022-06-24 MED ORDER — HYDROCOD POLI-CHLORPHE POLI ER 10-8 MG/5ML PO SUER
ORAL | 0 refills | Status: AC
  Filled 2022-06-24: qty 115, 23d supply, fill #0

## 2022-06-24 MED ORDER — MEFLOQUINE HCL 250 MG PO TABS
ORAL_TABLET | ORAL | 0 refills | Status: AC
  Filled 2022-06-24: qty 6, 42d supply, fill #0

## 2022-06-24 MED ORDER — PREDNISONE 10 MG PO TABS
ORAL_TABLET | ORAL | 0 refills | Status: DC
  Filled 2022-06-24: qty 21, 6d supply, fill #0

## 2022-06-24 MED ORDER — AZITHROMYCIN 500 MG PO TABS
ORAL_TABLET | ORAL | 0 refills | Status: AC
  Filled 2022-06-24: qty 5, 5d supply, fill #0

## 2022-06-26 ENCOUNTER — Other Ambulatory Visit: Payer: Self-pay

## 2022-06-26 MED ORDER — CLONIDINE HCL 0.2 MG PO TABS: ORAL_TABLET | ORAL | 1 refills | Status: AC

## 2022-06-26 MED ORDER — AMLODIPINE BESYLATE 5 MG PO TABS
ORAL_TABLET | ORAL | 1 refills | Status: AC
  Filled 2022-06-26: qty 90, 90d supply, fill #0

## 2022-08-02 ENCOUNTER — Other Ambulatory Visit: Payer: Self-pay

## 2022-09-03 ENCOUNTER — Other Ambulatory Visit: Payer: Self-pay | Admitting: Internal Medicine

## 2022-09-03 DIAGNOSIS — Z1231 Encounter for screening mammogram for malignant neoplasm of breast: Secondary | ICD-10-CM

## 2022-09-04 ENCOUNTER — Other Ambulatory Visit: Payer: Self-pay

## 2022-09-09 ENCOUNTER — Other Ambulatory Visit: Payer: Self-pay

## 2022-09-16 ENCOUNTER — Other Ambulatory Visit: Payer: Self-pay

## 2022-09-16 MED ORDER — COVID-19 MRNA VAC-TRIS(PFIZER) 30 MCG/0.3ML IM SUSY
PREFILLED_SYRINGE | INTRAMUSCULAR | 0 refills | Status: AC
  Filled 2022-09-16: qty 0.3, 1d supply, fill #0

## 2022-09-22 ENCOUNTER — Other Ambulatory Visit: Payer: Self-pay

## 2022-09-22 MED ORDER — PHENTERMINE HCL 37.5 MG PO TABS
ORAL_TABLET | ORAL | 0 refills | Status: AC
  Filled 2022-09-22: qty 90, 90d supply, fill #0

## 2022-09-23 ENCOUNTER — Other Ambulatory Visit: Payer: Self-pay

## 2022-09-25 ENCOUNTER — Other Ambulatory Visit: Payer: Self-pay

## 2022-09-25 MED ORDER — AMLODIPINE BESYLATE 5 MG PO TABS
ORAL_TABLET | ORAL | 1 refills | Status: DC
  Filled 2022-09-25: qty 90, 90d supply, fill #0
  Filled 2022-12-29: qty 90, 90d supply, fill #1

## 2022-09-30 ENCOUNTER — Other Ambulatory Visit: Payer: Self-pay

## 2022-09-30 DIAGNOSIS — Z79899 Other long term (current) drug therapy: Secondary | ICD-10-CM | POA: Diagnosis not present

## 2022-09-30 DIAGNOSIS — Z6839 Body mass index (BMI) 39.0-39.9, adult: Secondary | ICD-10-CM | POA: Diagnosis not present

## 2022-09-30 DIAGNOSIS — Z0000 Encounter for general adult medical examination without abnormal findings: Secondary | ICD-10-CM | POA: Diagnosis not present

## 2022-09-30 DIAGNOSIS — I10 Essential (primary) hypertension: Secondary | ICD-10-CM | POA: Diagnosis not present

## 2022-09-30 DIAGNOSIS — E7800 Pure hypercholesterolemia, unspecified: Secondary | ICD-10-CM | POA: Diagnosis not present

## 2022-09-30 DIAGNOSIS — Z1211 Encounter for screening for malignant neoplasm of colon: Secondary | ICD-10-CM | POA: Diagnosis not present

## 2022-09-30 MED ORDER — PSEUDOEPHEDRINE-GUAIFENESIN ER 60-600 MG PO TB12: ORAL_TABLET | ORAL | 0 refills | Status: AC

## 2022-09-30 MED ORDER — AZITHROMYCIN 250 MG PO TABS
ORAL_TABLET | ORAL | 0 refills | Status: AC
  Filled 2022-09-30: qty 6, 5d supply, fill #0

## 2022-09-30 MED ORDER — PREDNISONE 10 MG PO TABS
ORAL_TABLET | ORAL | 0 refills | Status: AC
  Filled 2022-09-30: qty 21, 6d supply, fill #0

## 2022-10-08 ENCOUNTER — Other Ambulatory Visit: Payer: Self-pay

## 2022-10-14 ENCOUNTER — Ambulatory Visit
Admission: RE | Admit: 2022-10-14 | Discharge: 2022-10-14 | Disposition: A | Discharge status: Home / Self Care | Payer: 59 | Source: Ambulatory Visit | Attending: Internal Medicine | Admitting: Internal Medicine

## 2022-10-14 DIAGNOSIS — Z1231 Encounter for screening mammogram for malignant neoplasm of breast: Secondary | ICD-10-CM | POA: Diagnosis not present

## 2022-10-28 DIAGNOSIS — H524 Presbyopia: Secondary | ICD-10-CM | POA: Diagnosis not present

## 2022-10-31 DIAGNOSIS — Z1211 Encounter for screening for malignant neoplasm of colon: Secondary | ICD-10-CM | POA: Diagnosis not present

## 2022-12-09 ENCOUNTER — Other Ambulatory Visit: Payer: Self-pay

## 2022-12-09 DIAGNOSIS — Z6839 Body mass index (BMI) 39.0-39.9, adult: Secondary | ICD-10-CM | POA: Diagnosis not present

## 2022-12-09 DIAGNOSIS — I10 Essential (primary) hypertension: Secondary | ICD-10-CM | POA: Diagnosis not present

## 2022-12-09 DIAGNOSIS — E7800 Pure hypercholesterolemia, unspecified: Secondary | ICD-10-CM | POA: Diagnosis not present

## 2022-12-09 DIAGNOSIS — Z720 Tobacco use: Secondary | ICD-10-CM | POA: Diagnosis not present

## 2022-12-09 MED ORDER — WEGOVY 0.5 MG/0.5ML ~~LOC~~ SOAJ
0.5000 mg | SUBCUTANEOUS | 1 refills | Status: AC
  Filled 2022-12-09 – 2023-01-09 (×6): qty 2, 28d supply, fill #0

## 2022-12-11 ENCOUNTER — Other Ambulatory Visit: Payer: Self-pay

## 2022-12-11 MED ORDER — WEGOVY 0.25 MG/0.5ML ~~LOC~~ SOAJ
SUBCUTANEOUS | 1 refills | Status: AC
  Filled 2022-12-11: qty 2, 28d supply, fill #0

## 2022-12-12 ENCOUNTER — Other Ambulatory Visit: Payer: Self-pay

## 2022-12-12 MED ORDER — WEGOVY 0.25 MG/0.5ML ~~LOC~~ SOAJ
0.2500 mg | SUBCUTANEOUS | 1 refills | Status: AC
  Filled 2022-12-13 – 2022-12-17 (×3): qty 2, 28d supply, fill #0

## 2022-12-13 ENCOUNTER — Other Ambulatory Visit: Payer: Self-pay

## 2022-12-16 ENCOUNTER — Other Ambulatory Visit: Payer: Self-pay

## 2022-12-17 ENCOUNTER — Other Ambulatory Visit: Payer: Self-pay

## 2022-12-20 ENCOUNTER — Other Ambulatory Visit: Payer: Self-pay

## 2022-12-23 ENCOUNTER — Other Ambulatory Visit: Payer: Self-pay

## 2022-12-24 ENCOUNTER — Other Ambulatory Visit: Payer: Self-pay

## 2022-12-26 ENCOUNTER — Other Ambulatory Visit: Payer: Self-pay

## 2022-12-31 ENCOUNTER — Other Ambulatory Visit: Payer: Self-pay

## 2023-01-09 ENCOUNTER — Other Ambulatory Visit: Payer: Self-pay

## 2023-01-18 ENCOUNTER — Ambulatory Visit (INDEPENDENT_AMBULATORY_CARE_PROVIDER_SITE_OTHER): Payer: Commercial Managed Care - PPO

## 2023-01-18 ENCOUNTER — Ambulatory Visit
Admission: EM | Admit: 2023-01-18 | Discharge: 2023-01-18 | Disposition: A | Discharge status: Home / Self Care | Payer: Commercial Managed Care - PPO | Attending: Emergency Medicine | Admitting: Emergency Medicine

## 2023-01-18 DIAGNOSIS — M25511 Pain in right shoulder: Secondary | ICD-10-CM

## 2023-01-18 DIAGNOSIS — M19019 Primary osteoarthritis, unspecified shoulder: Secondary | ICD-10-CM | POA: Diagnosis not present

## 2023-01-18 MED ORDER — PREDNISONE 10 MG (21) PO TBPK: ORAL_TABLET | Freq: Every day | ORAL | 0 refills | Status: DC

## 2023-01-18 NOTE — Discharge Instructions (Addendum)
Take the prednisone as directed.  Follow up with your orthopedist.

## 2023-01-18 NOTE — ED Provider Notes (Signed)
[name]    CSN: [id number] Arrival date & time: 01/18/23  0841      History   Chief Complaint Chief Complaint  Patient presents with   Shoulder Pain    HPI [name] is a 62 y.o. female.  Patient presents with right shoulder pain 2 days.  No falls or injury.  The pain feels like "pulling" sensation.  She reports it is 10/10 pain; worse with movement; improves with rest.  No wounds, redness, bruising, swelling, numbness, weakness, or other symptoms.  Treatment attempted with ice packs, heat, Tylenol, ibuprofen.  She reports history of similar pain in this shoulder a year ago; had a steroid shot in her shoulder then which helped.  She has an appointment scheduled with orthopedist on 01/27/2023.  Her medical history includes hypertension and osteoarthritis.    The history is provided by the patient and medical records.    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 - NEGATIVE   dental implants     KNEE ARTHROPLASTY Left 12/23/2016   Procedure: COMPUTER ASSISTED TOTAL KNEE ARTHROPLASTY;  Surgeon: Dereck Leep, MD;  Location: [facility] ORS;  Service: Orthopedics;  Laterality: Left;   REPLACEMENT TOTAL KNEE      OB History   No obstetric history on file.      Home Medications    Prior to Admission medications   Medication Sig Start Date End Date Taking? Authorizing Provider  predniSONE (STERAPRED UNI-PAK 21 TAB) 10 MG (21) TBPK tablet Take by mouth daily. As directed 01/18/23  Yes Sharion Balloon, NP  acetaminophen (TYLENOL) 650 MG CR tablet Take 1,300 mg by mouth every 8 (eight) hours as needed for pain.    [provider]  amLODipine (NORVASC) 5 MG tablet Take 5 mg by mouth at bedtime.  12/18/15 12/17/16  [provider]  amLODipine (NORVASC) 5 MG tablet TAKE 1 TABLET BY MOUTH ONCE DAILY 03/19/21 03/19/22  Idelle Crouch, MD  amLODipine (NORVASC) 5 MG tablet Take 1 tablet (5 mg total) by mouth once daily 08/20/21     amLODipine (NORVASC) 5 MG tablet Take one tablet by mouth once daily 06/26/22     amLODipine (NORVASC) 5 MG tablet Take 1 tablet (5 mg total) by mouth once daily 09/25/22     amoxicillin (AMOXIL) 500 MG capsule Take 4 capsules by mouth one hour prior to dental treatment Patient not taking: Reported on 01/18/2023 03/06/21     amoxicillin (AMOXIL) 500 MG capsule Take 4 capsules (2,000 mg total) by mouth 1 hour prior to dental tx. Patient not taking: Reported on 01/18/2023 06/26/21     azithromycin (ZITHROMAX) 250 MG tablet Take 2 tablets by mouth on day 1, then 1 tablet daily on days 2-5. 09/30/22     azithromycin (ZITHROMAX) 500 MG tablet Take 1 tablet (500 mg total) by mouth once daily for 5 days Patient not taking: Reported on 01/18/2023 06/24/22     bisoprolol (ZEBETA) 5 MG tablet Take 1 tablet (  5 mg total) by mouth once daily 03/18/22     celecoxib (CELEBREX) 200 MG capsule Take 1 capsule (200 mg total) by mouth 2 (two) times daily 01/14/22     cetirizine (ZYRTEC) 10 MG tablet Take 10 mg by mouth daily as needed for allergies.    [provider]  cloNIDine (CATAPRES) 0.2 MG tablet Take 1 tablet (0.2 mg total) by mouth 2 (two) times daily 08/20/21     cloNIDine (CATAPRES) 0.2 MG tablet TAKE 1 TABLET BY MOUTH TWICE DAILY 03/18/22 03/18/23    cloNIDine (CATAPRES) 0.2 MG tablet Take 1 tablet (0.2 mg total) by mouth 2 (two) times daily 06/26/22     COVID-19 mRNA vaccine 2023-2024 (COMIRNATY) syringe Inject into the muscle. 09/16/22   Carlyle Basques, MD  enoxaparin  (LOVENOX) 30 MG/0.3ML injection Inject 0.3 mLs (30 mg total) into the skin every 12 (twelve) hours. Patient not taking: Reported on 01/18/2023 12/25/16   Watt Climes, PA  Hydrocod Polst-Chlorphen Polst (CHLORPHENIRAMINE-HYDROCODONE) 10-8 MG/5ML Take 5 mLs by mouth at bedtime as needed for Cough for up to 10 days Patient not taking: Reported on 01/18/2023 06/24/22     ibuprofen (ADVIL) 800 MG tablet Take 1 tablet (800 mg total) by mouth every 8 (eight) hours as needed. 04/19/20   Sharion Balloon, NP  losartan-hydrochlorothiazide (HYZAAR) 100-25 MG tablet Take 1 tablet by mouth at bedtime.  Patient not taking: Reported on 01/18/2023 12/18/15 12/17/16  [provider]  mefloquine (LARIAM) 250 MG tablet Take 1 tablet (250 mg total) by mouth every 7 (seven) days for 6 doses Begin 07/03/22 06/24/22     oxyCODONE (OXY IR/ROXICODONE) 5 MG immediate release tablet Take 1-2 tablets (5-10 mg total) by mouth every 4 (four) hours as needed for severe pain or breakthrough pain. 12/25/16   Watt Climes, PA  phentermine (ADIPEX-P) 37.5 MG tablet Take 1 tablet (37.5 mg total) by mouth every morning before breakfast for 30 days Patient not taking: Reported on 01/18/2023 09/03/21     phentermine (ADIPEX-P) 37.5 MG tablet Take 1 tablet (37.5 mg total) by mouth every morning before breakfast 09/22/22     potassium chloride SA (K-DUR,KLOR-CON) 20 MEQ tablet Take 20 mEq by mouth at bedtime.  Patient not taking: Reported on 01/18/2023 09/23/16   [provider]  potassium chloride SA (KLOR-CON M) 20 MEQ tablet Take 1 tablet (20 mEq total) by mouth 2 (two) times daily 03/11/22     potassium chloride SA (KLOR-CON M) 20 MEQ tablet TAKE 1 TABLET BY MOUTH TWICE DAILY Patient not taking: Reported on 01/18/2023 03/05/21 03/05/22  Idelle Crouch, MD  potassium chloride SA (KLOR-CON) 20 MEQ tablet Take 1 tablet (20 mEq total) by mouth 2 times daily Patient not taking: Reported on 01/18/2023 03/09/21     pseudoephedrine-guaifenesin (MUCINEX  D) 60-600 MG 12 hr tablet Take 1 tablet by mouth every 12 (twelve) hours for 5 days Patient not taking: Reported on 01/18/2023 06/24/22     pseudoephedrine-guaifenesin (MUCINEX D) 60-600 MG 12 hr tablet Take 1 tablet by mouth every 12 (twelve) hours for 5 days Patient not taking: Reported on 01/18/2023 09/30/22     rosuvastatin (CRESTOR) 10 MG tablet TAKE 1 TABLET BY MOUTH ONCE DAILY 01/28/22 02/04/23    Semaglutide-Weight Management (WEGOVY) 0.25 MG/0.5ML SOAJ Inject 0.5 mLs (0.25 mg total) subcutaneously once a week 12/11/22     Semaglutide-Weight Management (WEGOVY) 0.25 MG/0.5ML SOAJ Inject 0.25 mg into the skin once a week. 12/12/22  Semaglutide-Weight Management (WEGOVY) 0.5 MG/0.5ML SOAJ Inject 0.5 mg into the skin once a week. 12/09/22     telmisartan (MICARDIS) 80 MG tablet Take 1 tablet (80 mg total) by mouth once daily 03/18/22     traMADol (ULTRAM) 50 MG tablet Take 1-2 tablets (50-100 mg total) by mouth every 4 (four) hours as needed for moderate pain. 12/25/16   Watt Climes, PA  valACYclovir (VALTREX) 1000 MG tablet Take 1 tablet (1,000 mg total) by mouth 3 (three) times daily. 04/24/20   Sharion Balloon, NP  telmisartan-hydrochlorothiazide (MICARDIS HCT) 80-25 MG tablet Take 1 tablet by mouth once daily 02/17/22 06/11/22      Family History Family History  Problem Relation Age of Onset   Breast cancer Neg Hx     Social History Social History   Tobacco Use   Smoking status: Every Day    Packs/day: 0.50    Types: Cigarettes   Smokeless tobacco: Never  Substance Use Topics   Alcohol use: Yes    Comment: occ.   Drug use: No     Allergies   Cefaclor and Naproxen   Review of Systems Review of Systems  Constitutional:  Negative for chills and fever.  Musculoskeletal:  Positive for arthralgias. Negative for joint swelling.  Skin:  Negative for color change, rash and wound.  Neurological:  Negative for weakness and numbness.  All other systems reviewed and are  negative.    Physical Exam Triage Vital Signs ED Triage Vitals  Enc Vitals Group     BP 01/18/23 0854 125/85     Pulse Rate 01/18/23 0854 67     Resp 01/18/23 0854 18     Temp 01/18/23 0854 98.1 F (36.7 C)     Temp src --      SpO2 01/18/23 0854 98 %     Weight --      Height --      Head Circumference --      Peak Flow --      Pain Score 01/18/23 0847 10     Pain Loc --      Pain Edu? --      Excl. in [place]? --    No data found.  Updated Vital Signs BP 125/85   Pulse 67   Temp 98.1 F (36.7 C)   Resp 18   SpO2 98%   Visual Acuity Right Eye Distance:   Left Eye Distance:   Bilateral Distance:    Right Eye Near:   Left Eye Near:    Bilateral Near:     Physical Exam Vitals and nursing note reviewed.  Constitutional:      General: She is not in acute distress.    Appearance: Normal appearance. She is well-developed. She is not ill-appearing.  HENT:     Mouth/Throat:     Mouth: Mucous membranes are moist.  Cardiovascular:     Rate and Rhythm: Normal rate and regular rhythm.     Heart sounds: Normal heart sounds.  Pulmonary:     Effort: Pulmonary effort is normal. No respiratory distress.     Breath sounds: Normal breath sounds.  Abdominal:     Palpations: Abdomen is soft.     Tenderness: There is no abdominal tenderness.  Musculoskeletal:        General: Tenderness present. No swelling, deformity or signs of injury. Normal range of motion.       Arms:     Cervical back: Neck supple.  Skin:  General: Skin is warm and dry.     Capillary Refill: Capillary refill takes less than 2 seconds.     Findings: No bruising, erythema, lesion or rash.  Neurological:     General: No focal deficit present.     Mental Status: She is alert and oriented to person, place, and time.     Sensory: No sensory deficit.     Motor: No weakness.  Psychiatric:        Mood and Affect: Mood normal.        Behavior: Behavior normal.      UC Treatments / Results   Labs (all labs ordered are listed, but only abnormal results are displayed) Labs Reviewed - No data to display  EKG   Radiology DG Shoulder Right  Result Date: 01/18/2023 CLINICAL DATA:  Atraumatic right shoulder pain EXAM: RIGHT SHOULDER - 2+ VIEW COMPARISON:  None Available. FINDINGS: Frontal, transscapular, and axillary views of the right shoulder are obtained. No fracture, subluxation, or dislocation. There are moderate hypertrophic changes of the acromioclavicular joint. Mild glenohumeral joint space narrowing. Minimal spurring of the undersurface of the acromion process. Soft tissues are unremarkable. Visualized portions of the right chest are clear. IMPRESSION: 1. Degenerative changes of the right shoulder, greatest at the acromioclavicular joint. 2. No acute fracture. Electronically Signed   By: Randa Ngo M.D.   On: 01/18/2023 09:27    Procedures Procedures (including critical care time)  Medications Ordered in UC Medications - No data to display  Initial Impression / Assessment and Plan / UC Course  I have reviewed the triage vital signs and the nursing notes.  Pertinent labs & imaging results that were available during my care of the patient were reviewed by me and considered in my medical decision making (see chart for details).    Right shoulder pain.  Xray shows degenerative changes but no acute abnormality.  Treating today with prednisone taper.  Patient is already on Celebrex.  Instructed her to follow up with her orthopedist as scheduled on the 01/27/2023 or sooner.  Education provided on shoulder pain and arthritis.  She agrees to plan of care.   Final Clinical Impressions(s) / UC Diagnoses   Final diagnoses:  Acute pain of right shoulder  Arthritis pain of shoulder     Discharge Instructions      Take the prednisone as directed.  Follow up with your orthopedist.      ED Prescriptions     Medication Sig Dispense Auth. Provider   predniSONE (STERAPRED  UNI-PAK 21 TAB) 10 MG (21) TBPK tablet Take by mouth daily. As directed 21 tablet Sharion Balloon, NP      PDMP not reviewed this encounter.   Sharion Balloon, NP 01/18/23 [phone]

## 2023-01-18 NOTE — ED Triage Notes (Signed)
Patient to Urgent Care with complaints of right sided shoulder pain that started on Thursday. Denies any known injury.   Diagnosed with arthritis over a year ago and received a steroid shot. Has an appointment with ortho on 3/18.  Using heat/ ice/ tylenol/ motrin.

## 2023-01-27 ENCOUNTER — Other Ambulatory Visit: Payer: Self-pay

## 2023-01-27 DIAGNOSIS — M19011 Primary osteoarthritis, right shoulder: Secondary | ICD-10-CM | POA: Diagnosis not present

## 2023-01-27 DIAGNOSIS — M7581 Other shoulder lesions, right shoulder: Secondary | ICD-10-CM | POA: Diagnosis not present

## 2023-01-27 MED ORDER — WEGOVY 1 MG/0.5ML ~~LOC~~ SOAJ
1.0000 mg | SUBCUTANEOUS | 1 refills | Status: AC
  Filled 2023-01-27: qty 2, 28d supply, fill #0
  Filled 2023-02-26: qty 2, 28d supply, fill #1

## 2023-01-27 MED ORDER — MELOXICAM 15 MG PO TABS
15.0000 mg | ORAL_TABLET | Freq: Every day | ORAL | 1 refills | Status: DC
  Filled 2023-01-27: qty 30, 30d supply, fill #0
  Filled 2023-03-03: qty 30, 30d supply, fill #1

## 2023-01-31 ENCOUNTER — Other Ambulatory Visit: Payer: Self-pay

## 2023-02-03 ENCOUNTER — Other Ambulatory Visit: Payer: Self-pay

## 2023-02-03 MED ORDER — ROSUVASTATIN CALCIUM 10 MG PO TABS
10.0000 mg | ORAL_TABLET | Freq: Every day | ORAL | 3 refills | Status: AC
  Filled 2023-02-03: qty 90, 90d supply, fill #0
  Filled 2023-05-05: qty 90, 90d supply, fill #1
  Filled 2023-08-04: qty 90, 90d supply, fill #2
  Filled 2023-11-11: qty 90, 90d supply, fill #3

## 2023-02-04 ENCOUNTER — Other Ambulatory Visit: Payer: Self-pay

## 2023-02-24 ENCOUNTER — Other Ambulatory Visit: Payer: Self-pay

## 2023-02-24 MED ORDER — WEGOVY 1 MG/0.5ML ~~LOC~~ SOAJ
1.0000 mg | SUBCUTANEOUS | 1 refills | Status: DC
  Filled 2023-02-24: qty 2, 28d supply, fill #0

## 2023-02-25 ENCOUNTER — Other Ambulatory Visit: Payer: Self-pay

## 2023-03-03 ENCOUNTER — Other Ambulatory Visit: Payer: Self-pay

## 2023-03-03 DIAGNOSIS — Z79899 Other long term (current) drug therapy: Secondary | ICD-10-CM | POA: Diagnosis not present

## 2023-03-03 DIAGNOSIS — F17210 Nicotine dependence, cigarettes, uncomplicated: Secondary | ICD-10-CM | POA: Diagnosis not present

## 2023-03-03 DIAGNOSIS — I10 Essential (primary) hypertension: Secondary | ICD-10-CM | POA: Diagnosis not present

## 2023-03-03 DIAGNOSIS — Z720 Tobacco use: Secondary | ICD-10-CM | POA: Diagnosis not present

## 2023-03-03 DIAGNOSIS — Z6839 Body mass index (BMI) 39.0-39.9, adult: Secondary | ICD-10-CM | POA: Diagnosis not present

## 2023-03-03 DIAGNOSIS — E7800 Pure hypercholesterolemia, unspecified: Secondary | ICD-10-CM | POA: Diagnosis not present

## 2023-03-03 DIAGNOSIS — M7581 Other shoulder lesions, right shoulder: Secondary | ICD-10-CM | POA: Diagnosis not present

## 2023-03-03 MED ORDER — WEGOVY 2.4 MG/0.75ML ~~LOC~~ SOAJ
2.4000 mg | SUBCUTANEOUS | 3 refills | Status: AC
  Filled 2023-04-15: qty 3, 28d supply, fill #0
  Filled 2023-05-07 – 2023-05-18 (×2): qty 3, 28d supply, fill #1
  Filled 2023-06-23: qty 3, 28d supply, fill #2

## 2023-03-03 MED ORDER — WEGOVY 1.7 MG/0.75ML ~~LOC~~ SOAJ
1.7000 mg | SUBCUTANEOUS | 0 refills | Status: AC
  Filled 2023-03-03 – 2023-03-10 (×2): qty 3, 28d supply, fill #0

## 2023-03-10 ENCOUNTER — Other Ambulatory Visit: Payer: Self-pay

## 2023-03-10 DIAGNOSIS — M7581 Other shoulder lesions, right shoulder: Secondary | ICD-10-CM | POA: Diagnosis not present

## 2023-03-17 ENCOUNTER — Other Ambulatory Visit: Payer: Self-pay

## 2023-03-17 DIAGNOSIS — M7581 Other shoulder lesions, right shoulder: Secondary | ICD-10-CM | POA: Diagnosis not present

## 2023-03-17 MED ORDER — TELMISARTAN 80 MG PO TABS
80.0000 mg | ORAL_TABLET | Freq: Every day | ORAL | 3 refills | Status: DC
  Filled 2023-03-17: qty 90, 90d supply, fill #0
  Filled 2023-06-15: qty 90, 90d supply, fill #1
  Filled 2023-09-15: qty 90, 90d supply, fill #2
  Filled 2023-12-09: qty 90, 90d supply, fill #3

## 2023-03-17 MED ORDER — BISOPROLOL FUMARATE 5 MG PO TABS
5.0000 mg | ORAL_TABLET | Freq: Every day | ORAL | 3 refills | Status: DC
  Filled 2023-03-17: qty 90, 90d supply, fill #0
  Filled 2023-06-15: qty 90, 90d supply, fill #1
  Filled 2023-09-15: qty 90, 90d supply, fill #2
  Filled 2023-12-09: qty 90, 90d supply, fill #3

## 2023-04-08 ENCOUNTER — Other Ambulatory Visit: Payer: Self-pay

## 2023-04-08 MED ORDER — AMLODIPINE BESYLATE 5 MG PO TABS
5.0000 mg | ORAL_TABLET | Freq: Every day | ORAL | 1 refills | Status: DC
  Filled 2023-04-08: qty 90, 90d supply, fill #0
  Filled 2023-07-07: qty 90, 90d supply, fill #1

## 2023-04-14 ENCOUNTER — Other Ambulatory Visit: Payer: Self-pay

## 2023-04-14 DIAGNOSIS — M7581 Other shoulder lesions, right shoulder: Secondary | ICD-10-CM | POA: Diagnosis not present

## 2023-04-14 MED ORDER — CLONIDINE HCL 0.2 MG PO TABS
0.2000 mg | ORAL_TABLET | Freq: Two times a day (BID) | ORAL | 1 refills | Status: DC
  Filled 2023-04-14: qty 180, 90d supply, fill #0
  Filled 2023-08-04: qty 180, 90d supply, fill #1

## 2023-04-15 ENCOUNTER — Other Ambulatory Visit: Payer: Self-pay

## 2023-05-01 ENCOUNTER — Other Ambulatory Visit: Payer: Self-pay

## 2023-05-01 MED ORDER — MELOXICAM 15 MG PO TABS
15.0000 mg | ORAL_TABLET | Freq: Every day | ORAL | 1 refills | Status: DC
  Filled 2023-05-01: qty 30, 30d supply, fill #0
  Filled 2023-07-08: qty 30, 30d supply, fill #1

## 2023-05-02 ENCOUNTER — Other Ambulatory Visit: Payer: Self-pay

## 2023-05-07 ENCOUNTER — Other Ambulatory Visit: Payer: Self-pay

## 2023-05-18 ENCOUNTER — Other Ambulatory Visit: Payer: Self-pay

## 2023-06-16 ENCOUNTER — Other Ambulatory Visit: Payer: Self-pay

## 2023-06-23 ENCOUNTER — Other Ambulatory Visit: Payer: Self-pay

## 2023-08-20 ENCOUNTER — Other Ambulatory Visit: Payer: Self-pay

## 2023-08-20 MED ORDER — MELOXICAM 15 MG PO TABS
15.0000 mg | ORAL_TABLET | Freq: Every day | ORAL | 1 refills | Status: DC
  Filled 2023-08-20: qty 30, 30d supply, fill #0
  Filled 2023-09-30: qty 30, 30d supply, fill #1

## 2023-09-03 ENCOUNTER — Other Ambulatory Visit: Payer: Self-pay | Admitting: Internal Medicine

## 2023-09-03 DIAGNOSIS — Z1231 Encounter for screening mammogram for malignant neoplasm of breast: Secondary | ICD-10-CM

## 2023-09-08 ENCOUNTER — Other Ambulatory Visit: Payer: Self-pay

## 2023-09-08 MED ORDER — AMOXICILLIN 500 MG PO CAPS
2000.0000 mg | ORAL_CAPSULE | ORAL | 3 refills | Status: AC
  Filled 2023-09-08: qty 12, 3d supply, fill #0
  Filled 2024-04-02: qty 12, 3d supply, fill #1

## 2023-09-15 ENCOUNTER — Other Ambulatory Visit: Payer: Self-pay

## 2023-10-13 ENCOUNTER — Other Ambulatory Visit: Payer: Self-pay

## 2023-10-13 MED ORDER — AMLODIPINE BESYLATE 5 MG PO TABS
5.0000 mg | ORAL_TABLET | Freq: Every day | ORAL | 1 refills | Status: DC
  Filled 2023-10-13: qty 90, 90d supply, fill #0
  Filled 2024-01-13: qty 90, 90d supply, fill #1

## 2023-10-20 ENCOUNTER — Ambulatory Visit
Admission: RE | Admit: 2023-10-20 | Discharge: 2023-10-20 | Disposition: A | Discharge status: Home / Self Care | Payer: Commercial Managed Care - PPO | Source: Ambulatory Visit | Attending: Internal Medicine | Admitting: Internal Medicine

## 2023-10-20 DIAGNOSIS — Z1231 Encounter for screening mammogram for malignant neoplasm of breast: Secondary | ICD-10-CM | POA: Diagnosis not present

## 2023-11-03 DIAGNOSIS — H2513 Age-related nuclear cataract, bilateral: Secondary | ICD-10-CM | POA: Diagnosis not present

## 2023-11-26 ENCOUNTER — Other Ambulatory Visit: Payer: Self-pay

## 2023-11-26 MED ORDER — CLONIDINE HCL 0.2 MG PO TABS
0.2000 mg | ORAL_TABLET | Freq: Two times a day (BID) | ORAL | 1 refills | Status: DC
  Filled 2023-11-26: qty 180, 90d supply, fill #0
  Filled 2024-03-28: qty 180, 90d supply, fill #1

## 2024-01-05 ENCOUNTER — Other Ambulatory Visit: Payer: Self-pay

## 2024-01-05 ENCOUNTER — Ambulatory Visit
Admission: EM | Admit: 2024-01-05 | Discharge: 2024-01-05 | Disposition: A | Discharge status: Home / Self Care | Payer: Commercial Managed Care - PPO | Attending: Emergency Medicine | Admitting: Emergency Medicine

## 2024-01-05 ENCOUNTER — Encounter: Payer: Self-pay | Admitting: Emergency Medicine

## 2024-01-05 DIAGNOSIS — J069 Acute upper respiratory infection, unspecified: Secondary | ICD-10-CM

## 2024-01-05 DIAGNOSIS — R062 Wheezing: Secondary | ICD-10-CM

## 2024-01-05 LAB — POC COVID19/FLU A&B COMBO
Covid Antigen, POC: NEGATIVE
Influenza A Antigen, POC: NEGATIVE
Influenza B Antigen, POC: NEGATIVE

## 2024-01-05 MED ORDER — PROMETHAZINE-DM 6.25-15 MG/5ML PO SYRP
5.0000 mL | ORAL_SOLUTION | Freq: Four times a day (QID) | ORAL | 0 refills | Status: AC | PRN
  Filled 2024-01-05: qty 118, 6d supply, fill #0

## 2024-01-05 MED ORDER — PREDNISONE 10 MG (21) PO TBPK: ORAL_TABLET | Freq: Every day | ORAL | 0 refills | Status: DC

## 2024-01-05 MED ORDER — BENZONATATE 100 MG PO CAPS: 100.0000 mg | ORAL_CAPSULE | Freq: Three times a day (TID) | ORAL | 0 refills | Status: DC

## 2024-01-05 MED ORDER — PROMETHAZINE-DM 6.25-15 MG/5ML PO SYRP: 5.0000 mL | ORAL_SOLUTION | Freq: Four times a day (QID) | ORAL | 0 refills | Status: DC | PRN

## 2024-01-05 MED ORDER — BENZONATATE 100 MG PO CAPS
100.0000 mg | ORAL_CAPSULE | Freq: Three times a day (TID) | ORAL | 0 refills | Status: AC
  Filled 2024-01-05: qty 21, 7d supply, fill #0

## 2024-01-05 MED ORDER — PREDNISONE 10 MG PO TABS
ORAL_TABLET | Freq: Every day | ORAL | 0 refills | Status: AC
  Filled 2024-01-05: qty 21, 6d supply, fill #0

## 2024-01-05 NOTE — ED Triage Notes (Signed)
 Patient presents to [facility] for evaluation of cough, sore throat, chest pain with coughing, wheezing and fatigue, since yesterday afternoon.  Denies fever.

## 2024-01-05 NOTE — ED Provider Notes (Signed)
 [name]    CSN: [id number] Arrival date & time: 01/05/24  0813      History   Chief Complaint Chief Complaint  Patient presents with   Cough    HPI [name] is a 63 y.o. female.   Patient presents for evaluation of nasal congestion, rhinorrhea, nonproductive cough, wheezing, centralized chest pain from coughing and sore throat present for 1 day.  Recent travel.  Tolerating food and liquids but appetite has decreased.  Endorses difficulty sleeping overnight.  Has attempted use of Coricidin, Tylenol and liquid IV.  Denies fever.  Former smoker, stopped December 2024.  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 - NEGATIVE   dental implants     KNEE ARTHROPLASTY Left 12/23/2016   Procedure: COMPUTER ASSISTED TOTAL KNEE ARTHROPLASTY;  Surgeon: Donato Heinz, MD;  Location: [facility] ORS;  Service: Orthopedics;  Laterality: Left;   REPLACEMENT TOTAL KNEE      OB History   No obstetric history on file.      Home Medications    Prior to Admission medications   Medication Sig Start Date End Date Taking? Authorizing Provider  acetaminophen (TYLENOL) 650 MG CR tablet Take 1,300 mg by mouth every 8 (eight) hours as needed for pain.    [provider]  amLODipine (NORVASC) 5 MG tablet Take 5 mg by mouth at bedtime.  12/18/15 12/17/16  [provider]  amLODipine (NORVASC) 5 MG tablet TAKE 1 TABLET BY MOUTH ONCE DAILY 03/19/21 03/19/22  Marguarite Arbour, MD  amLODipine  (NORVASC) 5 MG tablet Take 1 tablet (5 mg total) by mouth once daily 08/20/21     amLODipine (NORVASC) 5 MG tablet Take one tablet by mouth once daily 06/26/22     amLODipine (NORVASC) 5 MG tablet Take 1 tablet (5 mg total) by mouth daily. 10/13/23     amoxicillin (AMOXIL) 500 MG capsule Take 4 capsules by mouth one hour prior to dental treatment Patient not taking: Reported on 01/18/2023 03/06/21     amoxicillin (AMOXIL) 500 MG capsule Take 4 capsules (2,000 mg total) by mouth 1 hour prior to dental tx. Patient not taking: Reported on 01/18/2023 06/26/21     amoxicillin (AMOXIL) 500 MG capsule Take 4 capsules (2,000 mg total) by mouth. 09/01/23     azithromycin (ZITHROMAX) 250 MG tablet Take 2 tablets by mouth on day 1, then 1 tablet daily on days 2-5. 09/30/22     azithromycin (ZITHROMAX) 500 MG tablet Take 1 tablet (500 mg total) by mouth once daily for 5 days Patient not taking: Reported on 01/18/2023 06/24/22     benzonatate (TESSALON) 100 MG capsule Take 1 capsule (100 mg total) by mouth every 8 (eight) hours. 01/05/24   Keisean Skowron, Elita Boone, NP  bisoprolol (ZEBETA) 5 MG tablet Take 1 tablet (5 mg total) by mouth daily. 03/17/23     celecoxib (CELEBREX) 200 MG capsule Take 1 capsule (200 mg total) by mouth 2 (two) times daily 01/14/22  cetirizine (ZYRTEC) 10 MG tablet Take 10 mg by mouth daily as needed for allergies.    [provider]  cloNIDine (CATAPRES) 0.2 MG tablet Take 1 tablet (0.2 mg total) by mouth 2 (two) times daily 08/20/21     cloNIDine (CATAPRES) 0.2 MG tablet TAKE 1 TABLET BY MOUTH TWICE DAILY 03/18/22 04/09/23    cloNIDine (CATAPRES) 0.2 MG tablet Take 1 tablet (0.2 mg total) by mouth 2 (two) times daily 06/26/22     cloNIDine (CATAPRES) 0.2 MG tablet Take 1 tablet (0.2 mg total) by mouth 2 (two) times daily. 11/26/23     COVID-19 mRNA vaccine 2023-2024 (COMIRNATY) syringe Inject into the muscle. 09/16/22   Judyann Munson, MD  enoxaparin (LOVENOX) 30 MG/0.3ML injection Inject 0.3 mLs  (30 mg total) into the skin every 12 (twelve) hours. Patient not taking: Reported on 01/18/2023 12/25/16   Tera Partridge, PA  Hydrocod Polst-Chlorphen Polst (CHLORPHENIRAMINE-HYDROCODONE) 10-8 MG/5ML Take 5 mLs by mouth at bedtime as needed for Cough for up to 10 days Patient not taking: Reported on 01/18/2023 06/24/22     ibuprofen (ADVIL) 800 MG tablet Take 1 tablet (800 mg total) by mouth every 8 (eight) hours as needed. 04/19/20   Mickie Bail, NP  losartan-hydrochlorothiazide (HYZAAR) 100-25 MG tablet Take 1 tablet by mouth at bedtime.  Patient not taking: Reported on 01/18/2023 12/18/15 12/17/16  [provider]  mefloquine (LARIAM) 250 MG tablet Take 1 tablet (250 mg total) by mouth every 7 (seven) days for 6 doses Begin 07/03/22 06/24/22     meloxicam (MOBIC) 15 MG tablet Take 1 tablet (15 mg total) by mouth daily. 08/20/23     oxyCODONE (OXY IR/ROXICODONE) 5 MG immediate release tablet Take 1-2 tablets (5-10 mg total) by mouth every 4 (four) hours as needed for severe pain or breakthrough pain. 12/25/16   Tera Partridge, PA  phentermine (ADIPEX-P) 37.5 MG tablet Take 1 tablet (37.5 mg total) by mouth every morning before breakfast for 30 days Patient not taking: Reported on 01/18/2023 09/03/21     phentermine (ADIPEX-P) 37.5 MG tablet Take 1 tablet (37.5 mg total) by mouth every morning before breakfast 09/22/22     potassium chloride SA (K-DUR,KLOR-CON) 20 MEQ tablet Take 20 mEq by mouth at bedtime.  Patient not taking: Reported on 01/18/2023 09/23/16   [provider]  potassium chloride SA (KLOR-CON M) 20 MEQ tablet Take 1 tablet (20 mEq total) by mouth 2 (two) times daily 03/11/22     potassium chloride SA (KLOR-CON M) 20 MEQ tablet TAKE 1 TABLET BY MOUTH TWICE DAILY Patient not taking: Reported on 01/18/2023 03/05/21 03/05/22  Marguarite Arbour, MD  potassium chloride SA (KLOR-CON) 20 MEQ tablet Take 1 tablet (20 mEq total) by mouth 2 times daily Patient not taking: Reported on 01/18/2023 03/09/21      predniSONE (DELTASONE) 10 MG tablet Take 6 tabs by mouth daily  for 1 days, then 5 tabs for 1 days, then 4 tabs for 1 days, then 3 tabs for 1 days, 2 tabs for 1 days, then 1 tab by mouth daily for 1 days 01/05/24   Valinda Hoar, NP  promethazine-dextromethorphan (PROMETHAZINE-DM) 6.25-15 MG/5ML syrup Take 5 mLs by mouth 4 (four) times daily as needed. 01/05/24   Valinda Hoar, NP  pseudoephedrine-guaifenesin (MUCINEX D) 60-600 MG 12 hr tablet Take 1 tablet by mouth every 12 (twelve) hours for 5 days Patient not taking: Reported on 01/18/2023 06/24/22     pseudoephedrine-guaifenesin [facility]  D) 60-600 MG 12 hr tablet Take 1 tablet by mouth every 12 (twelve) hours for 5 days Patient not taking: Reported on 01/18/2023 09/30/22     rosuvastatin (CRESTOR) 10 MG tablet Take 1 tablet (10 mg total) by mouth daily. 02/03/23 02/11/24    Semaglutide-Weight Management (WEGOVY) 0.25 MG/0.5ML SOAJ Inject 0.5 mLs (0.25 mg total) subcutaneously once a week 12/11/22     Semaglutide-Weight Management (WEGOVY) 0.25 MG/0.5ML SOAJ Inject 0.25 mg into the skin once a week. 12/12/22     Semaglutide-Weight Management (WEGOVY) 0.5 MG/0.5ML SOAJ Inject 0.5 mg into the skin once a week. 12/09/22     Semaglutide-Weight Management (WEGOVY) 1 MG/0.5ML SOAJ Inject 1 mg into the skin once a week. 01/27/23     Semaglutide-Weight Management (WEGOVY) 1.7 MG/0.75ML SOAJ Inject 1.7 mg into the skin once a week. 03/03/23     Semaglutide-Weight Management (WEGOVY) 2.4 MG/0.75ML SOAJ Take 2.4 mg by mouth once a week. 03/03/23     telmisartan (MICARDIS) 80 MG tablet Take 1 tablet (80 mg total) by mouth daily. 03/17/23     traMADol (ULTRAM) 50 MG tablet Take 1-2 tablets (50-100 mg total) by mouth every 4 (four) hours as needed for moderate pain. 12/25/16   Tera Partridge, PA  valACYclovir (VALTREX) 1000 MG tablet Take 1 tablet (1,000 mg total) by mouth 3 (three) times daily. 04/24/20   Mickie Bail, NP  telmisartan-hydrochlorothiazide (MICARDIS HCT)  80-25 MG tablet Take 1 tablet by mouth once daily 02/17/22 06/11/22      Family History Family History  Problem Relation Age of Onset   Breast cancer Neg Hx     Social History Social History   Tobacco Use   Smoking status: Every Day    Current packs/day: 0.50    Types: Cigarettes   Smokeless tobacco: Never  Substance Use Topics   Alcohol use: Yes    Comment: occ.   Drug use: No     Allergies   Cefaclor and Naproxen   Review of Systems Review of Systems   Physical Exam Triage Vital Signs ED Triage Vitals  Encounter Vitals Group     BP 01/05/24 0830 (!) 164/104     Systolic BP Percentile --      Diastolic BP Percentile --      Pulse Rate 01/05/24 0830 73     Resp 01/05/24 0830 18     Temp 01/05/24 0830 99.6 F (37.6 C)     Temp Source 01/05/24 0830 Oral     SpO2 01/05/24 0830 100 %     Weight --      Height --      Head Circumference --      Peak Flow --      Pain Score 01/05/24 0834 6     Pain Loc --      Pain Education --      Exclude from Growth Chart --    No data found.  Updated Vital Signs BP (!) 164/104 (BP Location: Right Arm)   Pulse 73   Temp 99.6 F (37.6 C) (Oral) Comment: Tylenol this morning  Resp 18   SpO2 100%   Visual Acuity Right Eye Distance:   Left Eye Distance:   Bilateral Distance:    Right Eye Near:   Left Eye Near:    Bilateral Near:     Physical Exam Constitutional:      Appearance: Normal appearance.  HENT:     Head: Normocephalic.  Right Ear: Tympanic membrane, ear canal and external ear normal.     Left Ear: Tympanic membrane, ear canal and external ear normal.     Nose: Congestion present. No rhinorrhea.     Mouth/Throat:     Pharynx: No oropharyngeal exudate or posterior oropharyngeal erythema.  Eyes:     Extraocular Movements: Extraocular movements intact.  Cardiovascular:     Rate and Rhythm: Normal rate and regular rhythm.     Pulses: Normal pulses.     Heart sounds: Normal heart sounds.  Pulmonary:      Effort: Pulmonary effort is normal.     Breath sounds: Normal breath sounds.  Musculoskeletal:     Cervical back: Normal range of motion and neck supple.  Neurological:     Mental Status: She is alert and oriented to person, place, and time. Mental status is at baseline.      UC Treatments / Results  Labs (all labs ordered are listed, but only abnormal results are displayed) Labs Reviewed  POC COVID19/FLU A&B COMBO - Normal    EKG   Radiology No results found.  Procedures Procedures (including critical care time)  Medications Ordered in UC Medications - No data to display  Initial Impression / Assessment and Plan / UC Course  I have reviewed the triage vital signs and the nursing notes.  Pertinent labs & imaging results that were available during my care of the patient were reviewed by me and considered in my medical decision making (see chart for details).  Viral URI with cough, wheezing  Patient is in no signs of distress nor toxic appearing.  Vital signs are stable.  Low suspicion for pneumonia, pneumothorax or bronchitis and therefore will defer imaging.  COVID and flu test negative.  Prescribed prednisone, Tessalon and Promethazine DM, watch wait antibiotic placed at pharmacy if no improvement seen.May use additional over-the-counter medications as needed for supportive care.  May follow-up with urgent care as needed if symptoms persist or worsen.  Note given.   Final Clinical Impressions(s) / UC Diagnoses   Final diagnoses:  Viral URI with cough  Wheezing     Discharge Instructions      Your symptoms today are most likely being caused by a virus and should steadily improve in time it can take up to 7 to 10 days before you truly start to see a turnaround however things will get better if no improvement seen by Sunday may pick up azithromycin   Begin prednisone every morning with food as directed to open and relax the airway, should help with  wheezing  You may use Tessalon pill as needed for cough, may use cough syrup every 6 hours as needed for additional comfort, be mindful cough syrup can make you sleepy     You can take Tylenol  as needed for fever reduction and pain relief.   For cough: honey 1/2 to 1 teaspoon (you can dilute the honey in water or another fluid).  You can also use guaifenesin and dextromethorphan for cough. You can use a humidifier for chest congestion and cough.  If you don't have a humidifier, you can sit in the bathroom with the hot shower running.      For sore throat: try warm salt water gargles, cepacol lozenges, throat spray, warm tea or water with lemon/honey, popsicles or ice, or OTC cold relief medicine for throat discomfort.   For congestion: take a daily anti-histamine like Zyrtec, Claritin, and a oral decongestant, such as pseudoephedrine.  You can also use Flonase 1-2 sprays in each nostril daily.   It is important to stay hydrated: drink plenty of fluids (water, gatorade/powerade/pedialyte, juices, or teas) to keep your throat moisturized and help further relieve irritation/discomfort.    ED Prescriptions     Medication Sig Dispense Auth. Provider   predniSONE (STERAPRED UNI-PAK 21 TAB) 10 MG (21) TBPK tablet  (Status: Discontinued) Take by mouth daily. Take 6 tabs by mouth daily  for 1 days, then 5 tabs for 1 days, then 4 tabs for 1 days, then 3 tabs for 1 days, 2 tabs for 1 days, then 1 tab by mouth daily for 1 days 21 tablet Jaydeen Odor R, NP   benzonatate (TESSALON) 100 MG capsule  (Status: Discontinued) Take 1 capsule (100 mg total) by mouth every 8 (eight) hours. 21 capsule Kasidee Voisin R, NP   promethazine-dextromethorphan (PROMETHAZINE-DM) 6.25-15 MG/5ML syrup  (Status: Discontinued) Take 5 mLs by mouth 4 (four) times daily as needed. 118 mL Ysabel Cowgill R, NP   benzonatate (TESSALON) 100 MG capsule Take 1 capsule (100 mg total) by mouth every 8 (eight) hours. 21 capsule Murice Barbar,  Ronin Crager R, NP   predniSONE (DELTASONE) 10 MG tablet Take 6 tabs by mouth daily  for 1 days, then 5 tabs for 1 days, then 4 tabs for 1 days, then 3 tabs for 1 days, 2 tabs for 1 days, then 1 tab by mouth daily for 1 days 21 tablet Deandria Klute R, NP   promethazine-dextromethorphan (PROMETHAZINE-DM) 6.25-15 MG/5ML syrup Take 5 mLs by mouth 4 (four) times daily as needed. 118 mL Artavis Cowie, Elita Boone, NP      PDMP not reviewed this encounter.   Valinda Hoar, [place] 01/05/24 [phone]

## 2024-01-05 NOTE — Discharge Instructions (Signed)
 Your symptoms today are most likely being caused by a virus and should steadily improve in time it can take up to 7 to 10 days before you truly start to see a turnaround however things will get better if no improvement seen by Sunday may pick up azithromycin   Begin prednisone every morning with food as directed to open and relax the airway, should help with wheezing  You may use Tessalon pill as needed for cough, may use cough syrup every 6 hours as needed for additional comfort, be mindful cough syrup can make you sleepy     You can take Tylenol  as needed for fever reduction and pain relief.   For cough: honey 1/2 to 1 teaspoon (you can dilute the honey in water or another fluid).  You can also use guaifenesin and dextromethorphan for cough. You can use a humidifier for chest congestion and cough.  If you don't have a humidifier, you can sit in the bathroom with the hot shower running.      For sore throat: try warm salt water gargles, cepacol lozenges, throat spray, warm tea or water with lemon/honey, popsicles or ice, or OTC cold relief medicine for throat discomfort.   For congestion: take a daily anti-histamine like Zyrtec, Claritin, and a oral decongestant, such as pseudoephedrine.  You can also use Flonase 1-2 sprays in each nostril daily.   It is important to stay hydrated: drink plenty of fluids (water, gatorade/powerade/pedialyte, juices, or teas) to keep your throat moisturized and help further relieve irritation/discomfort.

## 2024-02-11 ENCOUNTER — Other Ambulatory Visit: Payer: Self-pay

## 2024-02-12 ENCOUNTER — Other Ambulatory Visit: Payer: Self-pay

## 2024-02-12 MED ORDER — ROSUVASTATIN CALCIUM 10 MG PO TABS
10.0000 mg | ORAL_TABLET | Freq: Every day | ORAL | 0 refills | Status: DC
  Filled 2024-02-12: qty 90, 90d supply, fill #0

## 2024-02-18 ENCOUNTER — Other Ambulatory Visit: Payer: Self-pay

## 2024-02-18 MED ORDER — MELOXICAM 15 MG PO TABS
15.0000 mg | ORAL_TABLET | Freq: Every day | ORAL | 1 refills | Status: AC
  Filled 2024-02-18: qty 30, 30d supply, fill #0

## 2024-02-19 ENCOUNTER — Other Ambulatory Visit: Payer: Self-pay

## 2024-03-28 ENCOUNTER — Other Ambulatory Visit: Payer: Self-pay

## 2024-04-02 ENCOUNTER — Other Ambulatory Visit: Payer: Self-pay

## 2024-04-07 ENCOUNTER — Other Ambulatory Visit: Payer: Self-pay

## 2024-04-07 MED ORDER — TELMISARTAN 80 MG PO TABS
80.0000 mg | ORAL_TABLET | Freq: Every day | ORAL | 3 refills | Status: AC
  Filled 2024-04-07: qty 90, 90d supply, fill #0
  Filled 2024-07-04: qty 90, 90d supply, fill #1
  Filled 2024-10-09: qty 90, 90d supply, fill #2

## 2024-04-07 MED ORDER — BISOPROLOL FUMARATE 5 MG PO TABS
5.0000 mg | ORAL_TABLET | Freq: Every day | ORAL | 3 refills | Status: AC
  Filled 2024-04-07: qty 90, 90d supply, fill #0
  Filled 2024-07-04: qty 90, 90d supply, fill #1
  Filled 2024-10-03: qty 90, 90d supply, fill #2

## 2024-04-08 ENCOUNTER — Other Ambulatory Visit: Payer: Self-pay

## 2024-04-18 ENCOUNTER — Other Ambulatory Visit: Payer: Self-pay

## 2024-04-19 ENCOUNTER — Other Ambulatory Visit: Payer: Self-pay

## 2024-04-19 MED ORDER — AMLODIPINE BESYLATE 5 MG PO TABS
5.0000 mg | ORAL_TABLET | Freq: Every day | ORAL | 1 refills | Status: DC
  Filled 2024-04-19: qty 90, 90d supply, fill #0
  Filled 2024-07-23: qty 90, 90d supply, fill #1

## 2024-05-17 ENCOUNTER — Other Ambulatory Visit: Payer: Self-pay

## 2024-05-17 MED ORDER — ROSUVASTATIN CALCIUM 10 MG PO TABS
10.0000 mg | ORAL_TABLET | Freq: Every day | ORAL | 0 refills | Status: DC
  Filled 2024-05-17: qty 90, 90d supply, fill #0

## 2024-05-18 ENCOUNTER — Other Ambulatory Visit: Payer: Self-pay

## 2024-08-02 ENCOUNTER — Other Ambulatory Visit: Payer: Self-pay

## 2024-08-02 DIAGNOSIS — I10 Essential (primary) hypertension: Secondary | ICD-10-CM | POA: Diagnosis not present

## 2024-08-02 DIAGNOSIS — Z23 Encounter for immunization: Secondary | ICD-10-CM | POA: Diagnosis not present

## 2024-08-02 DIAGNOSIS — Z1331 Encounter for screening for depression: Secondary | ICD-10-CM | POA: Diagnosis not present

## 2024-08-02 DIAGNOSIS — Z1231 Encounter for screening mammogram for malignant neoplasm of breast: Secondary | ICD-10-CM | POA: Diagnosis not present

## 2024-08-02 DIAGNOSIS — Z0000 Encounter for general adult medical examination without abnormal findings: Secondary | ICD-10-CM | POA: Diagnosis not present

## 2024-08-02 DIAGNOSIS — Z131 Encounter for screening for diabetes mellitus: Secondary | ICD-10-CM | POA: Diagnosis not present

## 2024-08-02 DIAGNOSIS — E66812 Obesity, class 2: Secondary | ICD-10-CM | POA: Diagnosis not present

## 2024-08-02 DIAGNOSIS — E7800 Pure hypercholesterolemia, unspecified: Secondary | ICD-10-CM | POA: Diagnosis not present

## 2024-08-02 DIAGNOSIS — Z79899 Other long term (current) drug therapy: Secondary | ICD-10-CM | POA: Diagnosis not present

## 2024-08-02 MED ORDER — PHENTERMINE HCL 37.5 MG PO TABS
37.5000 mg | ORAL_TABLET | Freq: Every morning | ORAL | 2 refills | Status: AC
  Filled 2024-08-02: qty 30, 30d supply, fill #0

## 2024-08-03 ENCOUNTER — Other Ambulatory Visit: Payer: Self-pay

## 2024-08-03 ENCOUNTER — Other Ambulatory Visit: Payer: Self-pay | Admitting: Internal Medicine

## 2024-08-03 DIAGNOSIS — Z1231 Encounter for screening mammogram for malignant neoplasm of breast: Secondary | ICD-10-CM

## 2024-08-03 MED ORDER — ROSUVASTATIN CALCIUM 20 MG PO TABS
20.0000 mg | ORAL_TABLET | Freq: Every day | ORAL | 3 refills | Status: AC
  Filled 2024-08-03 – 2024-08-17 (×2): qty 90, 90d supply, fill #0
  Filled 2024-11-09: qty 90, 90d supply, fill #1

## 2024-08-08 ENCOUNTER — Other Ambulatory Visit: Payer: Self-pay

## 2024-08-08 MED ORDER — CLONIDINE HCL 0.2 MG PO TABS
0.2000 mg | ORAL_TABLET | Freq: Two times a day (BID) | ORAL | 1 refills | Status: AC
  Filled 2024-08-08 – 2024-12-14 (×2): qty 180, 90d supply, fill #0

## 2024-08-09 ENCOUNTER — Other Ambulatory Visit: Payer: Self-pay

## 2024-08-13 ENCOUNTER — Other Ambulatory Visit: Payer: Self-pay

## 2024-08-16 ENCOUNTER — Other Ambulatory Visit: Payer: Self-pay

## 2024-08-17 ENCOUNTER — Other Ambulatory Visit: Payer: Self-pay

## 2024-08-22 ENCOUNTER — Other Ambulatory Visit: Payer: Self-pay

## 2024-08-24 ENCOUNTER — Other Ambulatory Visit: Payer: Self-pay

## 2024-08-24 MED ORDER — PHENTERMINE HCL 37.5 MG PO TABS
37.5000 mg | ORAL_TABLET | Freq: Every day | ORAL | 0 refills | Status: DC
  Filled 2024-08-24 – 2024-08-26 (×2): qty 10, 10d supply, fill #0

## 2024-08-26 ENCOUNTER — Other Ambulatory Visit: Payer: Self-pay

## 2024-08-27 DIAGNOSIS — Z1211 Encounter for screening for malignant neoplasm of colon: Secondary | ICD-10-CM | POA: Diagnosis not present

## 2024-09-09 ENCOUNTER — Other Ambulatory Visit: Payer: Self-pay

## 2024-09-09 MED ORDER — PHENTERMINE HCL 37.5 MG PO TABS
37.5000 mg | ORAL_TABLET | Freq: Every day | ORAL | 0 refills | Status: DC
  Filled 2024-09-09: qty 30, 30d supply, fill #0

## 2024-10-05 ENCOUNTER — Other Ambulatory Visit: Payer: Self-pay

## 2024-10-06 ENCOUNTER — Other Ambulatory Visit: Payer: Self-pay

## 2024-10-10 ENCOUNTER — Other Ambulatory Visit: Payer: Self-pay

## 2024-10-10 MED ORDER — PHENTERMINE HCL 37.5 MG PO TABS
37.5000 mg | ORAL_TABLET | Freq: Every day | ORAL | 0 refills | Status: DC
  Filled 2024-10-10: qty 30, 30d supply, fill #0

## 2024-10-11 ENCOUNTER — Other Ambulatory Visit: Payer: Self-pay

## 2024-10-23 ENCOUNTER — Other Ambulatory Visit: Payer: Self-pay

## 2024-10-24 ENCOUNTER — Other Ambulatory Visit: Payer: Self-pay

## 2024-10-24 MED ORDER — AMLODIPINE BESYLATE 5 MG PO TABS
5.0000 mg | ORAL_TABLET | Freq: Every day | ORAL | 1 refills | Status: AC
  Filled 2024-10-24: qty 90, 90d supply, fill #0

## 2024-10-25 ENCOUNTER — Ambulatory Visit
Admission: RE | Admit: 2024-10-25 | Discharge: 2024-10-25 | Disposition: A | Source: Ambulatory Visit | Attending: Internal Medicine | Admitting: Internal Medicine

## 2024-10-25 DIAGNOSIS — Z1231 Encounter for screening mammogram for malignant neoplasm of breast: Secondary | ICD-10-CM | POA: Insufficient documentation

## 2024-11-01 ENCOUNTER — Encounter: Payer: Self-pay | Admitting: Internal Medicine

## 2024-11-02 ENCOUNTER — Other Ambulatory Visit: Payer: Self-pay | Admitting: Internal Medicine

## 2024-11-02 DIAGNOSIS — R928 Other abnormal and inconclusive findings on diagnostic imaging of breast: Secondary | ICD-10-CM

## 2024-11-07 ENCOUNTER — Other Ambulatory Visit: Payer: Self-pay

## 2024-11-08 ENCOUNTER — Other Ambulatory Visit: Payer: Self-pay

## 2024-11-08 MED ORDER — PHENTERMINE HCL 37.5 MG PO TABS
37.5000 mg | ORAL_TABLET | Freq: Every day | ORAL | 0 refills | Status: DC
  Filled 2024-11-08: qty 30, 30d supply, fill #0

## 2024-11-12 ENCOUNTER — Ambulatory Visit
Admission: RE | Admit: 2024-11-12 | Discharge: 2024-11-12 | Disposition: A | Discharge status: Home / Self Care | Source: Ambulatory Visit | Attending: Internal Medicine | Admitting: Internal Medicine

## 2024-11-12 ENCOUNTER — Other Ambulatory Visit: Payer: Self-pay | Admitting: Internal Medicine

## 2024-11-12 DIAGNOSIS — R928 Other abnormal and inconclusive findings on diagnostic imaging of breast: Secondary | ICD-10-CM | POA: Insufficient documentation

## 2024-11-22 ENCOUNTER — Other Ambulatory Visit: Payer: Self-pay | Admitting: Internal Medicine

## 2024-11-22 DIAGNOSIS — R928 Other abnormal and inconclusive findings on diagnostic imaging of breast: Secondary | ICD-10-CM

## 2024-11-23 ENCOUNTER — Ambulatory Visit
Admission: RE | Admit: 2024-11-23 | Discharge: 2024-11-23 | Disposition: A | Source: Ambulatory Visit | Attending: Internal Medicine | Admitting: Internal Medicine

## 2024-11-23 ENCOUNTER — Ambulatory Visit
Admission: RE | Admit: 2024-11-23 | Discharge: 2024-11-23 | Disposition: A | Discharge status: Home / Self Care | Source: Ambulatory Visit | Attending: Internal Medicine | Admitting: Internal Medicine

## 2024-11-23 ENCOUNTER — Other Ambulatory Visit: Payer: Self-pay | Admitting: Internal Medicine

## 2024-11-23 ENCOUNTER — Inpatient Hospital Stay: Admission: RE | Admit: 2024-11-23 | Discharge: 2024-11-23 | Attending: Internal Medicine | Admitting: Internal Medicine

## 2024-11-23 DIAGNOSIS — R928 Other abnormal and inconclusive findings on diagnostic imaging of breast: Secondary | ICD-10-CM

## 2024-11-23 DIAGNOSIS — N6489 Other specified disorders of breast: Secondary | ICD-10-CM | POA: Insufficient documentation

## 2024-11-23 HISTORY — PX: BREAST BIOPSY: SHX20

## 2024-11-23 MED ORDER — LIDOCAINE 1 % OPTIME INJ - NO CHARGE
5.0000 mL | Freq: Once | INTRAMUSCULAR | Status: AC
  Administered 2024-11-23: 5 mL
  Filled 2024-11-23: qty 6

## 2024-11-23 MED ORDER — LIDOCAINE-EPINEPHRINE 1 %-1:100000 IJ SOLN
20.0000 mL | Freq: Once | INTRAMUSCULAR | Status: AC
  Administered 2024-11-23: 20 mL
  Filled 2024-11-23: qty 20

## 2024-11-24 LAB — SURGICAL PATHOLOGY

## 2024-12-07 ENCOUNTER — Other Ambulatory Visit: Payer: Self-pay

## 2024-12-07 MED ORDER — PHENTERMINE HCL 37.5 MG PO TABS
37.5000 mg | ORAL_TABLET | Freq: Every day | ORAL | 0 refills | Status: AC
  Filled 2024-12-07: qty 30, 30d supply, fill #0

## 2024-12-08 ENCOUNTER — Other Ambulatory Visit: Payer: Self-pay

## 2024-12-11 ENCOUNTER — Other Ambulatory Visit (HOSPITAL_COMMUNITY): Payer: Self-pay

## 2024-12-14 ENCOUNTER — Other Ambulatory Visit: Payer: Self-pay

## 2024-12-15 ENCOUNTER — Other Ambulatory Visit: Payer: Self-pay
